# Patient Record
Sex: Female | Born: 1980 | Race: Black or African American | Hispanic: No | Marital: Single | Smoking: Current every day smoker
Health system: Southern US, Community
[De-identification: ages and names within clinical notes are randomized; demographics above are authoritative.]

## PROBLEM LIST (undated history)

## (undated) DIAGNOSIS — F32A Depression, unspecified: Secondary | ICD-10-CM

## (undated) DIAGNOSIS — F329 Major depressive disorder, single episode, unspecified: Secondary | ICD-10-CM

## (undated) DIAGNOSIS — N83209 Unspecified ovarian cyst, unspecified side: Secondary | ICD-10-CM

## (undated) HISTORY — PX: BACK SURGERY: SHX140

## (undated) HISTORY — DX: Depression, unspecified: F32.A

---
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
## 2013-05-19 ENCOUNTER — Emergency Department: Payer: Self-pay | Admitting: Emergency Medicine

## 2013-05-19 LAB — URINALYSIS, COMPLETE
Leukocyte Esterase: NEGATIVE
Nitrite: NEGATIVE
Specific Gravity: 1.008 (ref 1.003–1.030)
Squamous Epithelial: 16

## 2013-09-18 ENCOUNTER — Emergency Department: Payer: Self-pay | Admitting: Emergency Medicine

## 2013-12-24 ENCOUNTER — Emergency Department: Payer: Self-pay | Admitting: Emergency Medicine

## 2013-12-24 LAB — COMPREHENSIVE METABOLIC PANEL
ALBUMIN: 3.5 g/dL (ref 3.4–5.0)
Alkaline Phosphatase: 59 U/L
Anion Gap: 4 — ABNORMAL LOW (ref 7–16)
BUN: 9 mg/dL (ref 7–18)
Bilirubin,Total: 0.2 mg/dL (ref 0.2–1.0)
CHLORIDE: 108 mmol/L — AB (ref 98–107)
CO2: 28 mmol/L (ref 21–32)
Calcium, Total: 8.5 mg/dL (ref 8.5–10.1)
Creatinine: 1.3 mg/dL (ref 0.60–1.30)
EGFR (Non-African Amer.): 53 — ABNORMAL LOW
Glucose: 91 mg/dL (ref 65–99)
Osmolality: 278 (ref 275–301)
Potassium: 3.9 mmol/L (ref 3.5–5.1)
SGOT(AST): 7 U/L — ABNORMAL LOW (ref 15–37)
SGPT (ALT): 11 U/L — ABNORMAL LOW (ref 12–78)
Sodium: 140 mmol/L (ref 136–145)
Total Protein: 6.8 g/dL (ref 6.4–8.2)

## 2013-12-24 LAB — CBC WITH DIFFERENTIAL/PLATELET
BASOS ABS: 0.1 10*3/uL (ref 0.0–0.1)
Basophil %: 1.2 %
Eosinophil #: 0.3 10*3/uL (ref 0.0–0.7)
Eosinophil %: 5.4 %
HCT: 33.3 % — ABNORMAL LOW (ref 35.0–47.0)
HGB: 10.5 g/dL — ABNORMAL LOW (ref 12.0–16.0)
LYMPHS ABS: 1.6 10*3/uL (ref 1.0–3.6)
LYMPHS PCT: 28.8 %
MCH: 27.5 pg (ref 26.0–34.0)
MCHC: 31.7 g/dL — AB (ref 32.0–36.0)
MCV: 87 fL (ref 80–100)
Monocyte #: 0.5 x10 3/mm (ref 0.2–0.9)
Monocyte %: 8.6 %
NEUTROS ABS: 3.2 10*3/uL (ref 1.4–6.5)
NEUTROS PCT: 56 %
Platelet: 214 10*3/uL (ref 150–440)
RBC: 3.83 10*6/uL (ref 3.80–5.20)
RDW: 15.6 % — ABNORMAL HIGH (ref 11.5–14.5)
WBC: 5.7 10*3/uL (ref 3.6–11.0)

## 2013-12-24 LAB — URINALYSIS, COMPLETE
BILIRUBIN, UR: NEGATIVE
GLUCOSE, UR: NEGATIVE mg/dL (ref 0–75)
Ketone: NEGATIVE
Nitrite: NEGATIVE
PROTEIN: NEGATIVE
Ph: 5 (ref 4.5–8.0)
Specific Gravity: 1.006 (ref 1.003–1.030)
Squamous Epithelial: 5
WBC UR: 11 /HPF (ref 0–5)

## 2015-04-25 ENCOUNTER — Encounter: Payer: Self-pay | Admitting: Medical Oncology

## 2015-04-25 ENCOUNTER — Other Ambulatory Visit: Payer: Self-pay

## 2015-04-25 ENCOUNTER — Emergency Department
Admission: EM | Admit: 2015-04-25 | Discharge: 2015-04-25 | Disposition: A | Discharge status: Home / Self Care | Payer: BLUE CROSS/BLUE SHIELD | Attending: Emergency Medicine | Admitting: Emergency Medicine

## 2015-04-25 DIAGNOSIS — Z720 Tobacco use: Secondary | ICD-10-CM | POA: Insufficient documentation

## 2015-04-25 DIAGNOSIS — Z880 Allergy status to penicillin: Secondary | ICD-10-CM | POA: Diagnosis not present

## 2015-04-25 DIAGNOSIS — R42 Dizziness and giddiness: Secondary | ICD-10-CM | POA: Diagnosis present

## 2015-04-25 DIAGNOSIS — Z3202 Encounter for pregnancy test, result negative: Secondary | ICD-10-CM | POA: Insufficient documentation

## 2015-04-25 DIAGNOSIS — R55 Syncope and collapse: Secondary | ICD-10-CM | POA: Diagnosis not present

## 2015-04-25 LAB — BASIC METABOLIC PANEL
Anion gap: 7 (ref 5–15)
BUN: 11 mg/dL (ref 6–20)
CHLORIDE: 106 mmol/L (ref 101–111)
CO2: 25 mmol/L (ref 22–32)
CREATININE: 0.91 mg/dL (ref 0.44–1.00)
Calcium: 8.9 mg/dL (ref 8.9–10.3)
GFR calc Af Amer: >60 mL/min (ref >60)
Glucose, Bld: 106 mg/dL — ABNORMAL HIGH (ref 65–99)
POTASSIUM: 4 mmol/L (ref 3.5–5.1)
Sodium: 138 mmol/L (ref 135–145)

## 2015-04-25 LAB — CBC
HCT: 36.9 % (ref 35.0–47.0)
Hemoglobin: 11.6 g/dL — ABNORMAL LOW (ref 12.0–16.0)
MCH: 26.9 pg (ref 26.0–34.0)
MCHC: 31.5 g/dL — AB (ref 32.0–36.0)
MCV: 85.3 fL (ref 80.0–100.0)
Platelets: 220 10*3/uL (ref 150–440)
RBC: 4.33 MIL/uL (ref 3.80–5.20)
RDW: 16 % — ABNORMAL HIGH (ref 11.5–14.5)
WBC: 6.2 10*3/uL (ref 3.6–11.0)

## 2015-04-25 LAB — GLUCOSE, CAPILLARY: GLUCOSE-CAPILLARY: 87 mg/dL (ref 65–99)

## 2015-04-25 LAB — URINALYSIS COMPLETE WITH MICROSCOPIC (ARMC ONLY)
Bacteria, UA: NONE SEEN
Bilirubin Urine: NEGATIVE
Glucose, UA: NEGATIVE mg/dL
HGB URINE DIPSTICK: NEGATIVE
KETONES UR: NEGATIVE mg/dL
LEUKOCYTES UA: NEGATIVE
Nitrite: NEGATIVE
PH: 5 (ref 5.0–8.0)
Protein, ur: NEGATIVE mg/dL
Specific Gravity, Urine: 1.012 (ref 1.005–1.030)

## 2015-04-25 LAB — TROPONIN I

## 2015-04-25 NOTE — ED Provider Notes (Signed)
[facility] Emergency Department Provider Note  ____________________________________________  Time seen: Approximately 2:14 PM  I have reviewed the triage vital signs and the nursing notes.   HISTORY  Chief Complaint Fatigue and Dizziness    HPI Sandra Sparks is a 34 y.o. female with no significant past medical history other than tobacco use and who has no primary care doctorwho presents feeling generalized weakness and lightheadedness for several days.  She reports that she had an episode of passing out yesterday.  She describe the circumstances in detail: She had not had anything to eat all day yesterday and was enrolling in her classes, waiting in [place], and walking around in the heat.  She was feeling lightheaded and was waiting in another line when she felt like things were starting to get dark and distant.  She apparently blacked out briefly.  She was alert and oriented upon awakening with no evidence of seizure activity.  She says that this is happened to her before under similar circumstances.  She knows that not eating and drinking can have this effect.  She does not have a doctor so she has never discussed this with anyone.  She states that she does not have specific concerns other than her generally not feeling well and feeling "stressed out".  He has 4 children at home in addition to going back to school.  Since the episode yesterday she has felt generalized weakness and lightheadedness.  She has not had any nausea or vomiting, chest pain, shortness of breath, abdominal pain, or dysuria.  She also denies fever and chills.  She describes her symptoms as moderate yesterday mild today.  The episode was gradual in onset yesterday.   History reviewed. No pertinent past medical history.  There are no active problems to display for this patient.   History reviewed. No pertinent past surgical history.  No current outpatient prescriptions on  file.  Allergies Penicillins  History reviewed. No pertinent family history.  Social History Social History  Substance Use Topics  . Smoking status: Current Every Day Smoker -- 0.50 packs/day    Types: Cigarettes  . Smokeless tobacco: None  . Alcohol Use: Yes     Comment: 3 x week    Review of Systems Constitutional: No fever/chills.  Generally feels lightheaded and weak. Eyes: No visual changes. ENT: No sore throat. Cardiovascular: Denies chest pain. Respiratory: Denies shortness of breath. Gastrointestinal: No abdominal pain.  No nausea, no vomiting.  No diarrhea.  No constipation. Genitourinary: Negative for dysuria. Musculoskeletal: Negative for back pain. Skin: Negative for rash. Neurological: Negative for headaches, focal weakness or numbness.  10-point ROS otherwise negative.  ____________________________________________   PHYSICAL EXAM:  VITAL SIGNS: ED Triage Vitals  Enc Vitals Group     BP 04/25/15 1225 117/76 mmHg     Pulse Rate 04/25/15 1225 87     Resp 04/25/15 1225 18     Temp 04/25/15 1225 98.4 F (36.9 C)     Temp Source 04/25/15 1225 Oral     SpO2 04/25/15 1225 98 %     Weight 04/25/15 1225 148 lb (67.132 kg)     Height 04/25/15 1225 5\' 5"  (1.651 m)     Head Cir --      Peak Flow --      Pain Score --      Pain Loc --      Pain Edu? --      Excl. in [place]? --     Constitutional:  Alert and oriented. Well appearing and in no acute distress. VSS. Eyes: Conjunctivae are normal. PERRL. EOMI. Head: Atraumatic. Nose: No congestion/rhinnorhea. Mouth/Throat: Mucous membranes are moist.  Oropharynx non-erythematous. Neck: No stridor.   Cardiovascular: Normal rate, regular rhythm. Grossly normal heart sounds.  Good peripheral circulation. Respiratory: Normal respiratory effort.  No retractions. Lungs CTAB. Gastrointestinal: Soft and nontender. No distention. No abdominal bruits. No CVA tenderness. Musculoskeletal: No lower extremity tenderness nor  edema.  No joint effusions. Neurologic:  Normal speech and language. No gross focal neurologic deficits are appreciated.  Skin:  Skin is warm, dry and intact. No rash noted. Psychiatric: Mood and affect are normal. Speech and behavior are normal.  ____________________________________________   LABS (all labs ordered are listed, but only abnormal results are displayed)  Labs Reviewed  BASIC METABOLIC PANEL - Abnormal; Notable for the following:    Glucose, Bld 106 (*)    All other components within normal limits  CBC - Abnormal; Notable for the following:    Hemoglobin 11.6 (*)    MCHC 31.5 (*)    RDW 16.0 (*)    All other components within normal limits  GLUCOSE, CAPILLARY  URINALYSIS COMPLETEWITH MICROSCOPIC ([facility] ONLY)  TROPONIN I  CBG MONITORING, ED  POC URINE PREG, ED   ____________________________________________  EKG  ED ECG REPORT I, Brittney Caraway, the attending physician, personally viewed and interpreted this ECG.  Date: 04/25/2015 EKG Time: 12:31 Rate: 77 Rhythm: normal sinus rhythm QRS Axis: normal Intervals: normal ST/T Wave abnormalities: Inverted T-wave in lead 3, otherwise unremarkable with no evidence of acute ischemia Conduction Disutrbances: none Narrative Interpretation: unremarkable  ____________________________________________  RADIOLOGY  No results found.  ____________________________________________   PROCEDURES  Procedure(s) performed: None  Critical Care performed: No ____________________________________________   INITIAL IMPRESSION / ASSESSMENT AND PLAN / ED COURSE  Pertinent labs & imaging results that were available during my care of the patient were reviewed by me and considered in my medical decision making (see chart for details).  The patient has a reassuring EKG, lab work, and physical exam.  The [place] syncope rule puts her in the low risk category.  She had what sounds like a vasovagal or orthostatic episode  likely secondary to poor by mouth intake and hot weather and with some exertion.    We discussed this and I encouraged by mouth intake, both clear fluids and food.  I am giving her several names of clinics that she can follow up with to establish a primary care doctor.  I gave her my usual and customary return precautions.  The patient understands and agrees with this plan.   (Note that documentation was delayed due to multiple ED patients requiring immediate care.)  Attempted to obtain orthostatic vital signs, but the patient was upset and refused them in one to leave.  I went back to speak with the patient and explained her reassuring results again.  She became tearful and we had an extensive conversation about all the stressors in her life, for children with no family in the area, going back to school, etc.  She has no SI/HI but is struggling with a lot of social/family issues at this time.  I recommended that she follow up with [facility], and she states that she has been there before and wants to go back again, although the $30 co-pay is difficult for her.  I encouraged her to follow up with them or to return to the emergency department if she needs to do so.  She understands and agrees and seemed to be reassured at the time of discharge.  ____________________________________________  FINAL CLINICAL IMPRESSION(S) / ED DIAGNOSES  Final diagnoses:  Syncope, unspecified syncope type      NEW MEDICATIONS STARTED DURING THIS VISIT:  New Prescriptions   No medications on file     Hinda Kehr, MD 04/25/15 2207

## 2015-04-25 NOTE — Discharge Instructions (Signed)
You have been seen today in the Emergency Department (ED)  for syncope (passing out).  Your workup including vital signs, labs, and EKG show reassuring results.  Your symptoms may be due to dehydration, so it is important that you drink plenty of non-alcoholic fluids.  Please call one of the doctors/clinics listed in these documents as soon as possible to schedule the next available clinic appointment to follow up with him/her regarding your visit to the ED and your symptoms.  Return to the Emergency Department (ED)  if you have any further syncopal episodes (pass out again) or develop ANY chest pain, pressure, tightness, trouble breathing, sudden sweating, or other symptoms that concern you.   Vasovagal Syncope, Adult Syncope, commonly known as fainting, is a temporary loss of consciousness. It occurs when the blood flow to the brain is reduced. Vasovagal syncope (also called neurocardiogenic syncope) is a fainting spell in which the blood flow to the brain is reduced because of a sudden drop in heart rate and blood pressure. Vasovagal syncope occurs when the brain and the cardiovascular system (blood vessels) do not adequately communicate and respond to each other. This is the most common cause of fainting. It often occurs in response to fear or some other type of emotional or physical stress. The body has a reaction in which the heart starts beating too slowly or the blood vessels expand, reducing blood pressure. This type of fainting spell is generally considered harmless. However, injuries can occur if a person takes a sudden fall during a fainting spell.  CAUSES  Vasovagal syncope occurs when a person's blood pressure and heart rate decrease suddenly, usually in response to a trigger. Many things and situations can trigger an episode. Some of these include:   Pain.   Fear.   The sight of blood or medical procedures, such as blood being drawn from a vein.   Common activities, such as  coughing, swallowing, stretching, or going to the bathroom.   Emotional stress.   Prolonged standing, especially in a warm environment.   Lack of sleep or rest.   Prolonged lack of food.   Prolonged lack of fluids.   Recent illness.  The use of certain drugs that affect blood pressure, such as cocaine, alcohol, marijuana, inhalants, and opiates.  SYMPTOMS  Before the fainting episode, you may:   Feel dizzy or light headed.   Become pale.  Sense that you are going to faint.   Feel like the room is spinning.   Have tunnel vision, only seeing directly in front of you.   Feel sick to your stomach (nauseous).   See spots or slowly lose vision.   Hear ringing in your ears.   Have a headache.   Feel warm and sweaty.   Feel a sensation of pins and needles. During the fainting spell, you will generally be unconscious for no longer than a couple minutes before waking up and returning to normal. If you get up too quickly before your body can recover, you may faint again. Some twitching or jerky movements may occur during the fainting spell.  DIAGNOSIS  Your caregiver will ask about your symptoms, take a medical history, and perform a physical exam. Various tests may be done to rule out other causes of fainting. These may include blood tests and tests to check the heart, such as electrocardiography, echocardiography, and possibly an electrophysiology study. When other causes have been ruled out, a test may be done to check the body's response to  changes in position (tilt table test). TREATMENT  Most cases of vasovagal syncope do not require treatment. Your caregiver may recommend ways to avoid fainting triggers and may provide home strategies for preventing fainting. If you must be exposed to a possible trigger, you can drink additional fluids to help reduce your chances of having an episode of vasovagal syncope. If you have warning signs of an oncoming episode, you can  respond by positioning yourself favorably (lying down). If your fainting spells continue, you may be given medicines to prevent fainting. Some medicines may help make you more resistant to repeated episodes of vasovagal syncope. Special exercises or compression stockings may be recommended. In rare cases, the surgical placement of a pacemaker is considered. HOME CARE INSTRUCTIONS   Learn to identify the warning signs of vasovagal syncope.   Sit or lie down at the first warning sign of a fainting spell. If sitting, put your head down between your legs. If you lie down, swing your legs up in the air to increase blood flow to the brain.   Avoid hot tubs and saunas.  Avoid prolonged standing.  Drink enough fluids to keep your urine clear or pale yellow. Avoid caffeine.  Increase salt in your diet as directed by your caregiver.   If you have to stand for a long time, perform movements such as:   Crossing your legs.   Flexing and stretching your leg muscles.   Squatting.   Moving your legs.   Bending over.   Only take over-the-counter or prescription medicines as directed by your caregiver. Do not suddenly stop any medicines without asking your caregiver first. SEEK MEDICAL CARE IF:   Your fainting spells continue or happen more frequently in spite of treatment.   You lose consciousness for more than a couple minutes.  You have fainting spells during or after exercising or after being startled.   You have new symptoms that occur with the fainting spells, such as:   Shortness of breath.  Chest pain.   Irregular heartbeat.   You have episodes of twitching or jerky movements that last longer than a few seconds.  You have episodes of twitching or jerky movements without obvious fainting. SEEK IMMEDIATE MEDICAL CARE IF:   You have injuries or bleeding after a fainting spell.   You have episodes of twitching or jerky movements that last longer than 5 minutes.    You have more than one spell of twitching or jerky movements before returning to consciousness after fainting. MAKE SURE YOU:   Understand these instructions.  Will watch your condition.  Will get help right away if you are not doing well or get worse. Document Released: 08/18/2012 Document Reviewed: 08/18/2012 [facility] Patient Information 2015 [place]. This information is not intended to replace advice given to you by your health care provider. Make sure you discuss any questions you have with your health care provider.

## 2015-04-25 NOTE — ED Notes (Signed)
Patient refused orthostatic vital signs. Patient is ready for discharge papers.

## 2015-04-25 NOTE — ED Notes (Signed)
Pt ambulatory to triage with reports that she "passed out" Monday and since then she has been feeling weak and dizzy. Denies pain. Also reports that she had chest pain Sunday but it has subsided.

## 2015-10-12 ENCOUNTER — Encounter: Payer: Self-pay | Admitting: *Deleted

## 2015-10-12 DIAGNOSIS — Z3202 Encounter for pregnancy test, result negative: Secondary | ICD-10-CM | POA: Insufficient documentation

## 2015-10-12 DIAGNOSIS — N760 Acute vaginitis: Secondary | ICD-10-CM | POA: Insufficient documentation

## 2015-10-12 DIAGNOSIS — Z880 Allergy status to penicillin: Secondary | ICD-10-CM | POA: Diagnosis not present

## 2015-10-12 DIAGNOSIS — R102 Pelvic and perineal pain: Secondary | ICD-10-CM | POA: Diagnosis present

## 2015-10-12 DIAGNOSIS — N83202 Unspecified ovarian cyst, left side: Secondary | ICD-10-CM | POA: Diagnosis not present

## 2015-10-12 DIAGNOSIS — F17210 Nicotine dependence, cigarettes, uncomplicated: Secondary | ICD-10-CM | POA: Insufficient documentation

## 2015-10-12 LAB — COMPREHENSIVE METABOLIC PANEL
ALK PHOS: 60 U/L (ref 38–126)
ALT: 11 U/L — AB (ref 14–54)
ANION GAP: 5 (ref 5–15)
AST: 12 U/L — ABNORMAL LOW (ref 15–41)
Albumin: 4.1 g/dL (ref 3.5–5.0)
BUN: 9 mg/dL (ref 6–20)
CALCIUM: 9 mg/dL (ref 8.9–10.3)
CO2: 28 mmol/L (ref 22–32)
CREATININE: 1.03 mg/dL — AB (ref 0.44–1.00)
Chloride: 106 mmol/L (ref 101–111)
GFR calc Af Amer: >60 mL/min (ref >60)
Glucose, Bld: 109 mg/dL — ABNORMAL HIGH (ref 65–99)
Potassium: 4.1 mmol/L (ref 3.5–5.1)
Sodium: 139 mmol/L (ref 135–145)
TOTAL PROTEIN: 7.5 g/dL (ref 6.5–8.1)

## 2015-10-12 LAB — URINALYSIS COMPLETE WITH MICROSCOPIC (ARMC ONLY)
Bilirubin Urine: NEGATIVE
Glucose, UA: NEGATIVE mg/dL
Hgb urine dipstick: NEGATIVE
Ketones, ur: NEGATIVE mg/dL
Leukocytes, UA: NEGATIVE
Nitrite: POSITIVE — AB
PH: 5 (ref 5.0–8.0)
PROTEIN: NEGATIVE mg/dL
Specific Gravity, Urine: 1.026 (ref 1.005–1.030)

## 2015-10-12 LAB — CBC
HCT: 37.3 % (ref 35.0–47.0)
Hemoglobin: 12 g/dL (ref 12.0–16.0)
MCH: 27.6 pg (ref 26.0–34.0)
MCHC: 32.2 g/dL (ref 32.0–36.0)
MCV: 85.6 fL (ref 80.0–100.0)
Platelets: 263 10*3/uL (ref 150–440)
RBC: 4.35 MIL/uL (ref 3.80–5.20)
RDW: 15.4 % — ABNORMAL HIGH (ref 11.5–14.5)
WBC: 7 10*3/uL (ref 3.6–11.0)

## 2015-10-12 LAB — POCT PREGNANCY, URINE: Preg Test, Ur: NEGATIVE

## 2015-10-12 LAB — LIPASE, BLOOD: Lipase: 22 U/L (ref 11–51)

## 2015-10-12 NOTE — ED Notes (Signed)
Pt c/o RLQ/R pelvic pain x 1 week, worsening tonight starting at 2030. Pt in no acute distress and is ambulatory to triage. Pt smells strongly of marijuana. Pt states she was diagnosed w/ ovarian cysts at [facility] x 2 weeks ago. Pt denies vaginal discharge and bleeding at this time. Pt states she took ibuprofen 600 mg at 17-1800 tonight w/o relief. Pt states she has had pain all week but has been unable to make time to see a physician and has f/u appt w/ ob-gyn in Feb.

## 2015-10-13 ENCOUNTER — Emergency Department: Payer: BLUE CROSS/BLUE SHIELD

## 2015-10-13 ENCOUNTER — Emergency Department
Admission: EM | Admit: 2015-10-13 | Discharge: 2015-10-13 | Disposition: A | Discharge status: Home / Self Care | Payer: BLUE CROSS/BLUE SHIELD | Attending: Emergency Medicine | Admitting: Emergency Medicine

## 2015-10-13 DIAGNOSIS — N83202 Unspecified ovarian cyst, left side: Secondary | ICD-10-CM

## 2015-10-13 DIAGNOSIS — N760 Acute vaginitis: Secondary | ICD-10-CM

## 2015-10-13 DIAGNOSIS — R102 Pelvic and perineal pain: Secondary | ICD-10-CM

## 2015-10-13 DIAGNOSIS — B9689 Other specified bacterial agents as the cause of diseases classified elsewhere: Secondary | ICD-10-CM

## 2015-10-13 HISTORY — DX: Unspecified ovarian cyst, unspecified side: N83.209

## 2015-10-13 LAB — CHLAMYDIA/NGC RT PCR (ARMC ONLY)
CHLAMYDIA TR: NOT DETECTED
N GONORRHOEAE: NOT DETECTED

## 2015-10-13 LAB — WET PREP, GENITAL
Sperm: NONE SEEN
Trich, Wet Prep: NONE SEEN
YEAST WET PREP: NONE SEEN

## 2015-10-13 MED ORDER — KETOROLAC TROMETHAMINE 10 MG PO TABS
10.0000 mg | ORAL_TABLET | Freq: Once | ORAL | Status: AC
  Administered 2015-10-13: 10 mg via ORAL
  Filled 2015-10-13: qty 1

## 2015-10-13 MED ORDER — METRONIDAZOLE 500 MG PO TABS: 500.0000 mg | ORAL_TABLET | Freq: Two times a day (BID) | ORAL | Status: AC

## 2015-10-13 MED ORDER — METRONIDAZOLE 500 MG PO TABS
ORAL_TABLET | ORAL | Status: AC
  Filled 2015-10-13: qty 1

## 2015-10-13 MED ORDER — METRONIDAZOLE 500 MG PO TABS
500.0000 mg | ORAL_TABLET | Freq: Once | ORAL | Status: AC
  Administered 2015-10-13: 500 mg via ORAL

## 2015-10-13 MED ORDER — OXYCODONE-ACETAMINOPHEN 5-325 MG PO TABS: 1.0000 | ORAL_TABLET | ORAL | Status: DC | PRN

## 2015-10-13 NOTE — Discharge Instructions (Signed)

## 2015-10-13 NOTE — ED Provider Notes (Signed)
[facility] Emergency Department Provider Note  ____________________________________________  Time seen: 1:45 AM  I have reviewed the triage vital signs and the nursing notes.   HISTORY  Chief Complaint Pelvic Pain     HPI Sandra Sparks is a 35 y.o. female presents with right pelvic pain times approximately 2 months with acute worsening today. Patient states that she was seen at [facility] 2 weeks ago and diagnosed with an ovarian cyst she is unclear what is     Past Medical History  Diagnosis Date  . Ovarian cyst     There are no active problems to display for this patient.   Past Surgical History  Procedure Laterality Date  . Abdominal surgery    . Back surgery      Current Outpatient Rx  Name  Route  Sig  Dispense  Refill  . ibuprofen (ADVIL,MOTRIN) 600 MG tablet   Oral   Take 600 mg by mouth every 6 (six) hours as needed for mild pain or moderate pain.            Allergies Penicillins  History reviewed. No pertinent family history.  Social History Social History  Substance Use Topics  . Smoking status: Current Every Day Smoker -- 0.50 packs/day    Types: Cigarettes  . Smokeless tobacco: Never Used  . Alcohol Use: Yes     Comment: 3 x week    Review of Systems  Constitutional: Negative for fever. Eyes: Negative for visual changes. ENT: Negative for sore throat. Cardiovascular: Negative for chest pain. Respiratory: Negative for shortness of breath. Gastrointestinal: Negative for abdominal pain, vomiting and diarrhea. Genitourinary: Negative for dysuria. Positive for pelvic pain Musculoskeletal: Negative for back pain. Skin: Negative for rash. Neurological: Negative for headaches, focal weakness or numbness.   10-point ROS otherwise negative.  ____________________________________________   PHYSICAL EXAM:  VITAL SIGNS: ED Triage Vitals  Enc Vitals Group     BP 10/12/15 2233 121/73 mmHg     Pulse Rate 10/12/15 2233  82     Resp 10/12/15 2233 20     Temp 10/12/15 2233 98.1 F (36.7 C)     Temp Source 10/12/15 2233 Oral     SpO2 10/12/15 2233 100 %     Weight 10/12/15 2233 148 lb (67.132 kg)     Height 10/12/15 2233 5\' 5"  (1.651 m)     Head Cir --      Peak Flow --      Pain Score 10/12/15 2234 10     Pain Loc --      Pain Edu? --      Excl. in [place]? --      Constitutional: Alert and oriented. Well appearing and in no distress. Eyes: Conjunctivae are normal. PERRL. Normal extraocular movements. ENT   Head: Normocephalic and atraumatic.   Nose: No congestion/rhinnorhea.   Mouth/Throat: Mucous membranes are moist.   Neck: No stridor. Hematological/Lymphatic/Immunilogical: No cervical lymphadenopathy. Cardiovascular: Normal rate, regular rhythm. Normal and symmetric distal pulses are present in all extremities. No murmurs, rubs, or gallops. Respiratory: Normal respiratory effort without tachypnea nor retractions. Breath sounds are clear and equal bilaterally. No wheezes/rales/rhonchi. Gastrointestinal: Soft and nontender. No distention. There is no CVA tenderness. Genitourinary: deferred Musculoskeletal: Nontender with normal range of motion in all extremities. No joint effusions.  No lower extremity tenderness nor edema. Neurologic:  Normal speech and language. No gross focal neurologic deficits are appreciated. Speech is normal.  Skin:  Skin is warm, dry and intact.  No rash noted. Psychiatric: Mood and affect are normal. Speech and behavior are normal. Patient exhibits appropriate insight and judgment.  ____________________________________________    LABS (pertinent positives/negatives)  Labs Reviewed  COMPREHENSIVE METABOLIC PANEL - Abnormal; Notable for the following:    Glucose, Bld 109 (*)    Creatinine, Ser 1.03 (*)    AST 12 (*)    ALT 11 (*)    Total Bilirubin <0.1 (*)    All other components within normal limits  CBC - Abnormal; Notable for the following:    RDW  15.4 (*)    All other components within normal limits  URINALYSIS COMPLETEWITH MICROSCOPIC ([facility] ONLY) - Abnormal; Notable for the following:    Color, Urine YELLOW (*)    APPearance CLOUDY (*)    Nitrite POSITIVE (*)    Bacteria, UA FEW (*)    Squamous Epithelial / LPF 6-30 (*)    All other components within normal limits  LIPASE, BLOOD  POCT PREGNANCY, URINE      RADIOLOGY      US Pelvis Complete (Final result) Result time: 10/13/15 05:06:32   Final result by Rad Results In Interface (10/13/15 05:06:32)   Narrative:   CLINICAL DATA: Acute onset of right-sided pelvic pain. Initial encounter.  EXAM: TRANSABDOMINAL AND TRANSVAGINAL ULTRASOUND OF PELVIS  TECHNIQUE: Both transabdominal and transvaginal ultrasound examinations of the pelvis were performed. Transabdominal technique was performed for global imaging of the pelvis including uterus, ovaries, adnexal regions, and pelvic cul-de-sac. It was necessary to proceed with endovaginal exam following the transabdominal exam to visualize the endometrium and ovaries in greater detail.  COMPARISON: None  FINDINGS: Uterus  Measurements: 9.8 x 5.5 x 6.3 cm. No fibroids or other mass visualized.  Endometrium  Thickness: 1.3 cm. No focal abnormality visualized.  Right ovary  Measurements: 3.2 x 2.0 x 2.3 cm. A small predominantly hyperechoic 1.8 cm focus within the right ovary may reflect a dermoid cyst.  Left ovary  Measurements: 4.0 x 3.0 x 2.7 cm. A mildly complex 2.5 cm left ovarian cyst is noted, possibly containing minimal hemorrhage.  Other findings  No abnormal free fluid.  IMPRESSION: 1. No evidence for ovarian torsion. Uterus unremarkable in appearance. 2. Small dermoid cyst suggested at the right ovary. 3. Mildly complex 2.5 cm left ovarian cyst may contain minimal hemorrhage.   Electronically Signed By: Garald Balding M.D. On: 10/13/2015 05:06          US Transvaginal Non-OB  (Final result) Result time: 10/13/15 05:06:32   Final result by Rad Results In Interface (10/13/15 05:06:32)   Narrative:   CLINICAL DATA: Acute onset of right-sided pelvic pain. Initial encounter.  EXAM: TRANSABDOMINAL AND TRANSVAGINAL ULTRASOUND OF PELVIS  TECHNIQUE: Both transabdominal and transvaginal ultrasound examinations of the pelvis were performed. Transabdominal technique was performed for global imaging of the pelvis including uterus, ovaries, adnexal regions, and pelvic cul-de-sac. It was necessary to proceed with endovaginal exam following the transabdominal exam to visualize the endometrium and ovaries in greater detail.  COMPARISON: None  FINDINGS: Uterus  Measurements: 9.8 x 5.5 x 6.3 cm. No fibroids or other mass visualized.  Endometrium  Thickness: 1.3 cm. No focal abnormality visualized.  Right ovary  Measurements: 3.2 x 2.0 x 2.3 cm. A small predominantly hyperechoic 1.8 cm focus within the right ovary may reflect a dermoid cyst.  Left ovary  Measurements: 4.0 x 3.0 x 2.7 cm. A mildly complex 2.5 cm left ovarian cyst is noted, possibly containing minimal hemorrhage.  Other findings  No abnormal free fluid.  IMPRESSION: 1. No evidence for ovarian torsion. Uterus unremarkable in appearance. 2. Small dermoid cyst suggested at the right ovary. 3. Mildly complex 2.5 cm left ovarian cyst may contain minimal hemorrhage.   Electronically Signed By: Garald Balding M.D. On: 10/13/2015 05:06          ____________________________________________   INITIAL IMPRESSION / ASSESSMENT AND PLAN / ED COURSE  Pertinent labs & imaging results that were available during my care of the patient were reviewed by me and considered in my medical decision making (see chart for details).  History & physical exam consistent with ovarian cyst which was confirmed on pelvic ultrasound. In addition wet prep revealed bacterial vaginosis for which  patient received Flagyl   ____________________________________________   FINAL CLINICAL IMPRESSION(S) / ED DIAGNOSES  Final diagnoses:  Left ovarian cyst  Bacterial vaginosis      Gregor Hams, MD 10/13/15 [id number]

## 2016-01-03 ENCOUNTER — Other Ambulatory Visit: Payer: Self-pay | Admitting: Family Medicine

## 2016-01-04 ENCOUNTER — Other Ambulatory Visit: Payer: Self-pay | Admitting: Family Medicine

## 2016-01-04 DIAGNOSIS — Z8742 Personal history of other diseases of the female genital tract: Secondary | ICD-10-CM

## 2016-01-11 ENCOUNTER — Ambulatory Visit
Admission: RE | Admit: 2016-01-11 | Discharge: 2016-01-11 | Disposition: A | Discharge status: Home / Self Care | Payer: BLUE CROSS/BLUE SHIELD | Source: Ambulatory Visit | Attending: Family Medicine | Admitting: Family Medicine

## 2016-01-11 DIAGNOSIS — N83201 Unspecified ovarian cyst, right side: Secondary | ICD-10-CM | POA: Insufficient documentation

## 2016-01-11 DIAGNOSIS — Z8742 Personal history of other diseases of the female genital tract: Secondary | ICD-10-CM

## 2016-06-12 DIAGNOSIS — R102 Pelvic and perineal pain: Secondary | ICD-10-CM | POA: Insufficient documentation

## 2016-06-12 DIAGNOSIS — D369 Benign neoplasm, unspecified site: Secondary | ICD-10-CM | POA: Insufficient documentation

## 2016-07-01 ENCOUNTER — Emergency Department
Admission: EM | Admit: 2016-07-01 | Discharge: 2016-07-01 | Disposition: A | Discharge status: Home / Self Care | Payer: BLUE CROSS/BLUE SHIELD | Attending: Emergency Medicine | Admitting: Emergency Medicine

## 2016-07-01 ENCOUNTER — Encounter: Payer: Self-pay | Admitting: Medical Oncology

## 2016-07-01 ENCOUNTER — Emergency Department: Payer: BLUE CROSS/BLUE SHIELD

## 2016-07-01 DIAGNOSIS — J069 Acute upper respiratory infection, unspecified: Secondary | ICD-10-CM | POA: Insufficient documentation

## 2016-07-01 DIAGNOSIS — Z791 Long term (current) use of non-steroidal anti-inflammatories (NSAID): Secondary | ICD-10-CM | POA: Insufficient documentation

## 2016-07-01 DIAGNOSIS — F17210 Nicotine dependence, cigarettes, uncomplicated: Secondary | ICD-10-CM | POA: Diagnosis not present

## 2016-07-01 DIAGNOSIS — R05 Cough: Secondary | ICD-10-CM | POA: Diagnosis present

## 2016-07-01 DIAGNOSIS — R0789 Other chest pain: Secondary | ICD-10-CM | POA: Insufficient documentation

## 2016-07-01 DIAGNOSIS — R112 Nausea with vomiting, unspecified: Secondary | ICD-10-CM | POA: Insufficient documentation

## 2016-07-01 MED ORDER — BENZONATATE 200 MG PO CAPS
200.0000 mg | ORAL_CAPSULE | Freq: Three times a day (TID) | ORAL | 0 refills | Status: DC | PRN
Start: 2016-07-01 — End: 2020-01-06

## 2016-07-01 MED ORDER — HYDROCOD POLST-CPM POLST ER 10-8 MG/5ML PO SUER: 5.0000 mL | Freq: Every evening | ORAL | 0 refills | Status: DC | PRN

## 2016-07-01 MED ORDER — IBUPROFEN 600 MG PO TABS: 600.0000 mg | ORAL_TABLET | Freq: Three times a day (TID) | ORAL | 0 refills | Status: DC | PRN

## 2016-07-01 NOTE — ED Notes (Signed)
States she developed chest congestion with cough about 1 week ago   Unsure of fever   Having some discomfort in chest with cough

## 2016-07-01 NOTE — ED Provider Notes (Signed)
[facility] Emergency Department Provider Note   ____________________________________________   First MD Initiated Contact with Patient 07/01/16 1656     (approximate)  I have reviewed the triage vital signs and the nursing notes.   HISTORY  Chief Complaint Cough; Fatigue; and Chest Pain   HPI Sandra Sparks is a 35 y.o. female with one week history of productive cough and associated chest pain unrelieved by OTC tylenol and Nyquil. She describes the pain as a tightness made worse with coughing. She denies hemoptysis, wheezing, or trouble breathing. Patient states she has had some N/V/D associated with these symptoms as well. No sick contacts.   Past Medical History:  Diagnosis Date  . Ovarian cyst     There are no active problems to display for this patient.   Past Surgical History:  Procedure Laterality Date  . BACK SURGERY      Prior to Admission medications   Medication Sig Start Date End Date Taking? Authorizing Provider  benzonatate (TESSALON) 200 MG capsule Take 1 capsule (200 mg total) by mouth 3 (three) times daily as needed for cough. 07/01/16   Sable Feil, PA-C  chlorpheniramine-HYDROcodone [facility] ER) 10-8 MG/5ML SUER Take 5 mLs by mouth at bedtime as needed for cough. 07/01/16   Sable Feil, PA-C  ibuprofen (ADVIL,MOTRIN) 600 MG tablet Take 600 mg by mouth every 6 (six) hours as needed for mild pain or moderate pain.     Historical Provider, MD  ibuprofen (ADVIL,MOTRIN) 600 MG tablet Take 1 tablet (600 mg total) by mouth every 8 (eight) hours as needed. 07/01/16   Sable Feil, PA-C  oxyCODONE-acetaminophen (ROXICET) 5-325 MG tablet Take 1 tablet by mouth every 4 (four) hours as needed for severe pain. 10/13/15   Gregor Hams, MD    Allergies Penicillins  No family history on file.  Social History Social History  Substance Use Topics  . Smoking status: Current Every Day Smoker    Packs/day: 0.50   Types: Cigarettes  . Smokeless tobacco: Never Used  . Alcohol use Yes     Comment: 3 x week    Review of Systems Constitutional: Subjective fever and positive for fatigue  ENT: Positive for sore throat. Cardiovascular: Chest pain associated with cough Respiratory: Denies shortness of breath or wheezing. Positive for cough Gastrointestinal: No abdominal pain.  Occasional nausea, vomiting, and diarrhea. Genitourinary: Negative for dysuria. Musculoskeletal: Negative for back pain. Skin: Negative for rash. Neurological: Negative for headaches, focal weakness or numbness. 10-point ROS otherwise negative.  ____________________________________________   PHYSICAL EXAM:  VITAL SIGNS: ED Triage Vitals  Enc Vitals Group     BP 07/01/16 1628 121/60     Pulse Rate 07/01/16 1628 76     Resp 07/01/16 1628 18     Temp 07/01/16 1628 98.3 F (36.8 C)     Temp Source 07/01/16 1628 Oral     SpO2 07/01/16 1628 100 %     Weight 07/01/16 1629 156 lb (70.8 kg)     Height 07/01/16 1629 5\' 6"  (1.676 m)     Head Circumference --      Peak Flow --      Pain Score 07/01/16 1636 8     Pain Loc --      Pain Edu? --      Excl. in [place]? --     Constitutional: Alert and oriented. Well appearing and in no acute distress. Eyes: Conjunctivae are normal. PERRL. EOMI. Head: Atraumatic. Nose:  No congestion/rhinnorhea. Mouth/Throat: Mucous membranes are moist.  Oropharynx non-erythematous.  Hematological/Lymphatic/Immunilogical: No cervical lymphadenopathy. Cardiovascular: Normal rate, regular rhythm. Grossly normal heart sounds.  Good peripheral circulation. Respiratory: Normal respiratory effort.  No retractions. Lungs CTAB. Gastrointestinal: Soft and nontender. No distention.  Musculoskeletal: Reproducible costochondral chest pain on palpation Neurologic:  Normal speech and language. No gross focal neurologic deficits are appreciated. No gait instability. Skin:  Skin is warm, dry and intact. No rash  noted. Psychiatric: Mood and affect are normal. Speech and behavior are normal.  ____________________________________________   LABS (all labs ordered are listed, but only abnormal results are displayed)  Labs Reviewed - No data to display ____________________________________________  EKG  EKG read by heart station doctor  ____________________________________________  RADIOLOGY  2-View chest x-ray showed no acute process ____________________________________________   PROCEDURES  Procedure(s) performed: None  Procedures  Critical Care performed: No  ____________________________________________   INITIAL IMPRESSION / ASSESSMENT AND PLAN / ED COURSE  Pertinent labs & imaging results that were available during my care of the patient were reviewed by me and considered in my medical decision making (see chart for details).  Upper respiratory infection 1. EKG for chest pain associated with cough was read by heart station doctor and showed no STEMI  2. 2-view chest x-ray showed no acute process  3. Continue supportive care with rest and fluids 4. Tessalon pearls and ibuprofen for day time cough and pain and tussionex for PM cough and pain.  5. Patient was given a work note  Clinical Course     ____________________________________________   FINAL CLINICAL IMPRESSION(S) / ED DIAGNOSES  Final diagnoses:  Upper respiratory tract infection, unspecified type      NEW MEDICATIONS STARTED DURING THIS VISIT:  New Prescriptions   BENZONATATE (TESSALON) 200 MG CAPSULE    Take 1 capsule (200 mg total) by mouth 3 (three) times daily as needed for cough.   CHLORPHENIRAMINE-HYDROCODONE (TUSSIONEX PENNKINETIC ER) 10-8 MG/5ML SUER    Take 5 mLs by mouth at bedtime as needed for cough.   IBUPROFEN (ADVIL,MOTRIN) 600 MG TABLET    Take 1 tablet (600 mg total) by mouth every 8 (eight) hours as needed.     Note:  This document was prepared using Dragon voice recognition  software and may include unintentional dictation errors.   Sable Feil, PA-C 07/01/16 [place], MD 07/01/16 2122

## 2016-07-01 NOTE — ED Notes (Signed)
Pt verbalized understanding of discharge instructions. NAD at this time. 

## 2016-07-01 NOTE — ED Triage Notes (Signed)
Pt reports chest pain with coughing that began 1 week ago with cold sx's and fatigue.

## 2016-07-01 NOTE — ED Notes (Signed)
EKG signed by Dr Archie Balboa, based on sx's pt can have x-ray and be seen in flex care area.

## 2017-01-10 ENCOUNTER — Encounter: Payer: Self-pay | Admitting: Emergency Medicine

## 2017-01-10 ENCOUNTER — Emergency Department
Admission: EM | Admit: 2017-01-10 | Discharge: 2017-01-10 | Disposition: A | Discharge status: Home / Self Care | Payer: BLUE CROSS/BLUE SHIELD | Attending: Emergency Medicine | Admitting: Emergency Medicine

## 2017-01-10 DIAGNOSIS — F17210 Nicotine dependence, cigarettes, uncomplicated: Secondary | ICD-10-CM | POA: Insufficient documentation

## 2017-01-10 DIAGNOSIS — Z791 Long term (current) use of non-steroidal anti-inflammatories (NSAID): Secondary | ICD-10-CM | POA: Insufficient documentation

## 2017-01-10 DIAGNOSIS — Z046 Encounter for general psychiatric examination, requested by authority: Secondary | ICD-10-CM | POA: Diagnosis present

## 2017-01-10 DIAGNOSIS — F3341 Major depressive disorder, recurrent, in partial remission: Secondary | ICD-10-CM | POA: Diagnosis not present

## 2017-01-10 DIAGNOSIS — Z79899 Other long term (current) drug therapy: Secondary | ICD-10-CM | POA: Diagnosis not present

## 2017-01-10 LAB — COMPREHENSIVE METABOLIC PANEL
ALT: 15 U/L (ref 14–54)
ANION GAP: 7 (ref 5–15)
AST: 15 U/L (ref 15–41)
Albumin: 3.8 g/dL (ref 3.5–5.0)
Alkaline Phosphatase: 56 U/L (ref 38–126)
BILIRUBIN TOTAL: 0.5 mg/dL (ref 0.3–1.2)
BUN: 9 mg/dL (ref 6–20)
CHLORIDE: 103 mmol/L (ref 101–111)
CO2: 24 mmol/L (ref 22–32)
Calcium: 8.7 mg/dL — ABNORMAL LOW (ref 8.9–10.3)
Creatinine, Ser: 0.9 mg/dL (ref 0.44–1.00)
GFR calc Af Amer: >60 mL/min (ref >60)
GFR calc non Af Amer: >60 mL/min (ref >60)
Glucose, Bld: 96 mg/dL (ref 65–99)
POTASSIUM: 4 mmol/L (ref 3.5–5.1)
Sodium: 134 mmol/L — ABNORMAL LOW (ref 135–145)
TOTAL PROTEIN: 7.1 g/dL (ref 6.5–8.1)

## 2017-01-10 LAB — CBC
HCT: 36.8 % (ref 35.0–47.0)
Hemoglobin: 12.1 g/dL (ref 12.0–16.0)
MCH: 28.1 pg (ref 26.0–34.0)
MCHC: 32.9 g/dL (ref 32.0–36.0)
MCV: 85.5 fL (ref 80.0–100.0)
PLATELETS: 252 10*3/uL (ref 150–440)
RBC: 4.3 MIL/uL (ref 3.80–5.20)
RDW: 15.7 % — AB (ref 11.5–14.5)
WBC: 4.9 10*3/uL (ref 3.6–11.0)

## 2017-01-10 LAB — ETHANOL

## 2017-01-10 LAB — SALICYLATE LEVEL

## 2017-01-10 LAB — ACETAMINOPHEN LEVEL: Acetaminophen (Tylenol), Serum: <10 ug/mL — ABNORMAL LOW (ref 10–30)

## 2017-01-10 MED ORDER — VENLAFAXINE HCL ER 37.5 MG PO CP24
37.5000 mg | ORAL_CAPSULE | ORAL | Status: AC
  Administered 2017-01-10: 37.5 mg via ORAL
  Filled 2017-01-10: qty 1

## 2017-01-10 MED ORDER — VENLAFAXINE HCL ER 37.5 MG PO CP24: 37.5000 mg | ORAL_CAPSULE | Freq: Two times a day (BID) | ORAL | 0 refills | Status: DC

## 2017-01-10 NOTE — ED Triage Notes (Addendum)
Pt reports running out of effexor four days ago. Pt presents requesting help with depression. Pt reports has felt some suicidal ideation the past few days. Denies SI today. Pt denied having a plan to hurt self. Pt presents voluntarily.

## 2017-01-10 NOTE — ED Notes (Addendum)
Pt has two earrings in upper right earlobe that could not be removed. Theadora Rama, Charge RN and Maudie Mercury, RN assigned nurse made aware.

## 2017-01-10 NOTE — ED Notes (Signed)
Pt verbalized understanding of discharge instructions. NAD at this time. 

## 2017-01-10 NOTE — ED Notes (Signed)
Gave dinner tray to pt. Pt stated not hungry will eat later.AS

## 2017-01-10 NOTE — ED Notes (Signed)
Pt changed into paper scrubs by this Probation officer.

## 2017-01-10 NOTE — ED Provider Notes (Signed)
[facility] Emergency Department Provider Note  ____________________________________________  Time seen: Approximately 4:54 PM  I have reviewed the triage vital signs and the nursing notes.   HISTORY  Chief Complaint Depression and Psychiatric Evaluation    HPI Sandra Sparks is a 36 y.o. female who reports that she has been on Effexor for a long time for her depression. She ran out 4 days ago and over last 4 days she's had worsening symptoms of feeling sad, tearfulness, poor appetite and poor sleep. Reports having some passing suicidal thoughts yesterday, which she discussed with a friend and her occupational nurse at work. They provided support, she went home and went to sleep to rest. She denies any plan or intention to harm herself. No HI or hallucinations. Denies history of suicide attempt. Follows up with Princella Ion.     Past Medical History:  Diagnosis Date  . Ovarian cyst      There are no active problems to display for this patient.    Past Surgical History:  Procedure Laterality Date  . BACK SURGERY       Prior to Admission medications   Medication Sig Start Date End Date Taking? Authorizing Provider  benzonatate (TESSALON) 200 MG capsule Take 1 capsule (200 mg total) by mouth 3 (three) times daily as needed for cough. Patient not taking: Reported on 01/10/2017 07/01/16   Sable Feil, PA-C  chlorpheniramine-HYDROcodone [facility] ER) 10-8 MG/5ML SUER Take 5 mLs by mouth at bedtime as needed for cough. Patient not taking: Reported on 01/10/2017 07/01/16   Sable Feil, PA-C  ibuprofen (ADVIL,MOTRIN) 600 MG tablet Take 1 tablet (600 mg total) by mouth every 8 (eight) hours as needed. Patient not taking: Reported on 01/10/2017 07/01/16   Sable Feil, PA-C  oxyCODONE-acetaminophen (ROXICET) 5-325 MG tablet Take 1 tablet by mouth every 4 (four) hours as needed for severe pain. Patient not taking: Reported on 01/10/2017  10/13/15   Gregor Hams, MD  venlafaxine XR (EFFEXOR-XR) 37.5 MG 24 hr capsule Take 1 capsule (37.5 mg total) by mouth 2 (two) times daily. 01/10/17   Carrie Mew, MD     Allergies Penicillins   No family history on file.  Social History Social History  Substance Use Topics  . Smoking status: Current Every Day Smoker    Packs/day: 0.50    Types: Cigarettes  . Smokeless tobacco: Never Used  . Alcohol use Yes     Comment: 3 x week    Review of Systems  Constitutional:   No fever or chills.  ENT:   No sore throat. No rhinorrhea. Lymphatic: No swollen glands, No extremity swelling Endocrine: No hot/cold flashes. No significant weight change. No neck swelling. Cardiovascular:   No chest pain or syncope. Respiratory:   No dyspnea or cough. Gastrointestinal:   Negative for abdominal pain, vomiting and diarrhea.  Genitourinary:   Negative for dysuria or difficulty urinating. Musculoskeletal:   Negative for focal pain or swelling Neurological:   Negative for headaches or weakness. All other systems reviewed and are negative except as documented above in ROS and HPI.  ____________________________________________   PHYSICAL EXAM:  VITAL SIGNS: ED Triage Vitals [01/10/17 1442]  Enc Vitals Group     BP 109/70     Pulse Rate 68     Resp 18     Temp 98.8 F (37.1 C)     Temp Source Oral     SpO2 100 %     Weight 162  lb (73.5 kg)     Height 5\' 5"  (1.651 m)     Head Circumference      Peak Flow      Pain Score      Pain Loc      Pain Edu?      Excl. in [place]?     Vital signs reviewed, nursing assessments reviewed.   Constitutional:   Alert and oriented. Well appearing and in no distress. Eyes:   No scleral icterus. No conjunctival pallor. PERRL. EOMI.  No nystagmus. ENT   Head:   Normocephalic and atraumatic.   Nose:   No congestion/rhinnorhea. No septal hematoma   Mouth/Throat:   MMM, no pharyngeal erythema. No peritonsillar mass.    Neck:   No  stridor. No SubQ emphysema. No meningismus. Hematological/Lymphatic/Immunilogical:   No cervical lymphadenopathy. Cardiovascular:   RRR. Symmetric bilateral radial and DP pulses.  No murmurs.  Respiratory:   Normal respiratory effort without tachypnea nor retractions. Breath sounds are clear and equal bilaterally. No wheezes/rales/rhonchi. Gastrointestinal:   Soft and nontender. Non distended. There is no CVA tenderness.  No rebound, rigidity, or guarding. Genitourinary:   deferred Musculoskeletal:   Normal range of motion in all extremities. No joint effusions.  No lower extremity tenderness.  No edema. Neurologic:   Normal speech and language.  CN 2-10 normal. Motor grossly intact. No gross focal neurologic deficits are appreciated.  Skin:    Skin is warm, dry and intact. No rash noted.  No petechiae, purpura, or bullae.  ____________________________________________    LABS (pertinent positives/negatives) (all labs ordered are listed, but only abnormal results are displayed) Labs Reviewed  COMPREHENSIVE METABOLIC PANEL - Abnormal; Notable for the following:       Result Value   Sodium 134 (*)    Calcium 8.7 (*)    All other components within normal limits  ACETAMINOPHEN LEVEL - Abnormal; Notable for the following:    Acetaminophen (Tylenol), Serum <10 (*)    All other components within normal limits  CBC - Abnormal; Notable for the following:    RDW 15.7 (*)    All other components within normal limits  ETHANOL  SALICYLATE LEVEL  URINE DRUG SCREEN, QUALITATIVE ([facility] ONLY)   ____________________________________________   EKG    ____________________________________________    RADIOLOGY  No results found.  ____________________________________________   PROCEDURES Procedures  ____________________________________________   INITIAL IMPRESSION / ASSESSMENT AND PLAN / ED COURSE  Pertinent labs & imaging results that were available during my care of the patient  were reviewed by me and considered in my medical decision making (see chart for details).  Patient is well-appearing, no acute distress. Normal vital signs. Unremarkable physical exam. Complains of worsening depression symptoms over the last few days in the setting of running out of her Effexor 4 days ago. She does not appear to be a danger to herself or others. She does not meet criteria for commitment. At this time she does not appear to warrant a psychiatry consultation as this is a question of medication refill. I advised her to continue following up with her primary care clinic. She expressed an interest in changing to another clinic where she would have one assigned physician that she would see regularly. I counseled her that this may take several months to get a new patient appointment and to continue following up with Princella Ion in the meantime while she works on that. She agrees. I will refill her Effexor today and give her a dose  now. Return precautions given. She has demonstrated that she is able to seek support and access resources if her depression further decompensates and she has any further suicidal thoughts. Very low risk and suitable for outpatient follow-up.     Clinical Course as of Jan 10 1653  Sat Jan 10, 2017  1551 P/w worsening depression sx after running out of Effexor 4 days ago. No si/hi/vah. Not a danger to self or others. Will refill med, f/u pcp.   [PS]    Clinical Course User Index [PS] Carrie Mew, MD     ____________________________________________   FINAL CLINICAL IMPRESSION(S) / ED DIAGNOSES  Final diagnoses:  Recurrent major depressive disorder, in partial remission [facility])      Current Discharge Medication List       Portions of this note were generated with dragon dictation software. Dictation errors may occur despite best attempts at proofreading.    Carrie Mew, MD 01/10/17 [phone]

## 2017-01-10 NOTE — ED Notes (Signed)
Pt sent pocketbook including wallet home with boyfriend.

## 2017-01-10 NOTE — ED Notes (Signed)
Pt told this RN she ran out of her depression medication on Tuesday and that she has been experiencing an increase in her depression since then. Pt states that she having a difficult time dealing with stressors in her life. Pt denies SI/HI.

## 2017-02-13 ENCOUNTER — Emergency Department
Admission: EM | Admit: 2017-02-13 | Discharge: 2017-02-13 | Disposition: A | Discharge status: Home / Self Care | Payer: BLUE CROSS/BLUE SHIELD | Attending: Emergency Medicine | Admitting: Emergency Medicine

## 2017-02-13 ENCOUNTER — Encounter: Payer: Self-pay | Admitting: Emergency Medicine

## 2017-02-13 DIAGNOSIS — Z79899 Other long term (current) drug therapy: Secondary | ICD-10-CM | POA: Insufficient documentation

## 2017-02-13 DIAGNOSIS — M542 Cervicalgia: Secondary | ICD-10-CM | POA: Diagnosis present

## 2017-02-13 DIAGNOSIS — F17210 Nicotine dependence, cigarettes, uncomplicated: Secondary | ICD-10-CM | POA: Diagnosis not present

## 2017-02-13 DIAGNOSIS — M791 Myalgia: Secondary | ICD-10-CM | POA: Diagnosis not present

## 2017-02-13 DIAGNOSIS — M7918 Myalgia, other site: Secondary | ICD-10-CM

## 2017-02-13 LAB — URINALYSIS, COMPLETE (UACMP) WITH MICROSCOPIC
Bacteria, UA: NONE SEEN
Bilirubin Urine: NEGATIVE
GLUCOSE, UA: NEGATIVE mg/dL
Hgb urine dipstick: NEGATIVE
KETONES UR: NEGATIVE mg/dL
Leukocytes, UA: NEGATIVE
Nitrite: NEGATIVE
PH: 6 (ref 5.0–8.0)
Protein, ur: NEGATIVE mg/dL
Specific Gravity, Urine: 1.026 (ref 1.005–1.030)

## 2017-02-13 LAB — POCT PREGNANCY, URINE: Preg Test, Ur: NEGATIVE

## 2017-02-13 MED ORDER — ORPHENADRINE CITRATE 30 MG/ML IJ SOLN
60.0000 mg | Freq: Two times a day (BID) | INTRAMUSCULAR | Status: DC
  Administered 2017-02-13: 60 mg via INTRAMUSCULAR
  Filled 2017-02-13: qty 2

## 2017-02-13 MED ORDER — NAPROXEN 500 MG PO TABS: 500.0000 mg | ORAL_TABLET | Freq: Two times a day (BID) | ORAL | 0 refills | Status: AC

## 2017-02-13 MED ORDER — KETOROLAC TROMETHAMINE 60 MG/2ML IM SOLN
30.0000 mg | Freq: Once | INTRAMUSCULAR | Status: AC
  Administered 2017-02-13: 30 mg via INTRAMUSCULAR
  Filled 2017-02-13: qty 2

## 2017-02-13 MED ORDER — CYCLOBENZAPRINE HCL 5 MG PO TABS: 5.0000 mg | ORAL_TABLET | Freq: Three times a day (TID) | ORAL | 0 refills | Status: AC | PRN

## 2017-02-13 NOTE — ED Triage Notes (Addendum)
Pt c/o lower back pain with urinary frequency for 2 days. Denies fevers.  Also would like neck checked while here. Having neck pain. Pain to neck when RN palpated.  Denies injury to neck. Has icy hot patch to neck, helping pain. Pt works in pt care and lifts pts.

## 2017-02-13 NOTE — ED Provider Notes (Signed)
[facility] Emergency Department Provider Note  ____________________________________________  Time seen: Approximately 6:37 PM  I have reviewed the triage vital signs and the nursing notes.   HISTORY  Chief Complaint Urinary Frequency and Neck Pain    HPI Sandra Sparks is a 36 y.o. female that presents to the emergency department with neck pain, upper back pain, low back pain, and urinary frequency for 2 days. She states that pain is constant and throbbing. 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 No recent illness or sick contacts. She is concerned for UTI. She denies fever, headache, visual changes, SOB, CP, nausea, vomiting, abdominal pain, dysuria, hematuria, bowel or bladder dysfunction, saddle paresthesias, vaginal discharge, numbness, tingling.    Past Medical History:  Diagnosis Date  . Ovarian cyst     There are no active problems to display for this patient.   Past Surgical History:  Procedure Laterality Date  . BACK SURGERY      Prior to Admission medications   Medication Sig Start Date End Date Taking? Authorizing Provider  benzonatate (TESSALON) 200 MG capsule Take 1 capsule (200 mg total) by mouth 3 (three) times daily as needed for cough. Patient not taking: Reported on 01/10/2017 07/01/16   Sable Feil, PA-C  chlorpheniramine-HYDROcodone [facility] ER) 10-8 MG/5ML SUER Take 5 mLs by mouth at bedtime as needed for cough. Patient not taking: Reported on 01/10/2017 07/01/16   Sable Feil, PA-C  cyclobenzaprine (FLEXERIL) 5 MG tablet Take 1 tablet (5 mg total) by mouth 3 (three) times daily as needed for muscle spasms. 02/13/17 02/20/17  Laban Emperor, PA-C  ibuprofen (ADVIL,MOTRIN) 600 MG tablet Take 1 tablet (600 mg total) by mouth every 8 (eight) hours as needed. Patient not taking: Reported on 01/10/2017 07/01/16   Sable Feil, PA-C  naproxen (NAPROSYN) 500 MG tablet Take 1 tablet (500 mg total) by mouth 2 (two)  times daily with a meal. 02/13/17 02/13/18  Laban Emperor, PA-C  oxyCODONE-acetaminophen (ROXICET) 5-325 MG tablet Take 1 tablet by mouth every 4 (four) hours as needed for severe pain. Patient not taking: Reported on 01/10/2017 10/13/15   Gregor Hams, MD  venlafaxine XR (EFFEXOR-XR) 37.5 MG 24 hr capsule Take 1 capsule (37.5 mg total) by mouth 2 (two) times daily. 01/10/17   Carrie Mew, MD    Allergies Penicillins  History reviewed. No pertinent family history.  Social History Social History  Substance Use Topics  . Smoking status: Current Every Day Smoker    Packs/day: 0.50    Types: Cigarettes  . Smokeless tobacco: Never Used  . Alcohol use Yes     Comment: 3 x week     Review of Systems  Constitutional: No fever/chills ENT: No upper respiratory complaints. Cardiovascular: No chest pain. Respiratory: No SOB. Gastrointestinal: No abdominal pain.  No nausea, no vomiting.  Genitourinary: Negative for dysuria. Musculoskeletal: Positive for back pain. Skin: Negative for rash, abrasions, lacerations, ecchymosis. Neurological: Negative for headaches, numbness or tingling   ____________________________________________   PHYSICAL EXAM:  VITAL SIGNS: ED Triage Vitals  Enc Vitals Group     BP 02/13/17 1740 (!) 119/42     Pulse Rate 02/13/17 1739 69     Resp 02/13/17 1739 18     Temp 02/13/17 1739 98.7 F (37.1 C)     Temp Source 02/13/17 1739 Oral     SpO2 02/13/17 1739 100 %     Weight 02/13/17 1739 160 lb (72.6 kg)  Height 02/13/17 1739 5\' 6"  (1.676 m)     Head Circumference --      Peak Flow --      Pain Score 02/13/17 1739 10     Pain Loc --      Pain Edu? --      Excl. in [place]? --      Constitutional: Alert and oriented. Well appearing and in no acute distress. Eyes: Conjunctivae are normal. PERRL. EOMI. Head: Atraumatic. ENT:      Ears:      Nose: No congestion/rhinnorhea.      Mouth/Throat: Mucous membranes are moist.  Neck: No stridor.  No  cervical spine tenderness to palpation. Full range of motion of neck. Cardiovascular: Normal rate, regular rhythm.  Good peripheral circulation. Respiratory: Normal respiratory effort without tachypnea or retractions. Lungs CTAB. Good air entry to the bases with no decreased or absent breath sounds. Gastrointestinal: Bowel sounds 4 quadrants. Soft and nontender to palpation. No guarding or rigidity. No palpable masses. No distention.  Musculoskeletal: Full range of motion to all extremities. No gross deformities appreciated. Tenderness to palpation throughout lumbar muscles. No focal tenderness over thoracic or lumbar spine. Negative straight leg raise and cross leg raise. Neurologic:  Normal speech and language. No gross focal neurologic deficits are appreciated.  Skin:  Skin is warm, dry and intact. No rash noted.   ____________________________________________   LABS (all labs ordered are listed, but only abnormal results are displayed)  Labs Reviewed  URINALYSIS, COMPLETE (UACMP) WITH MICROSCOPIC - Abnormal; Notable for the following:       Result Value   Color, Urine YELLOW (*)    APPearance HAZY (*)    Squamous Epithelial / LPF 6-30 (*)    All other components within normal limits  URINE CULTURE  POC URINE PREG, ED  POCT PREGNANCY, URINE   ____________________________________________  EKG   ____________________________________________  RADIOLOGY  No results found.  ____________________________________________    PROCEDURES  Procedure(s) performed:    Procedures    Medications  ketorolac (TORADOL) injection 30 mg (30 mg Intramuscular Given 02/13/17 1846)     ____________________________________________   INITIAL IMPRESSION / ASSESSMENT AND PLAN / ED COURSE  Pertinent labs & imaging results that were available during my care of the patient were reviewed by me and considered in my medical decision making (see chart for details).  Review of the [place] CSRS was  performed in accordance of the [place] prior to dispensing any controlled drugs.  Patient's diagnosis is consistent with musculoskeletal pain. Vital signs and exam are reassuring. No indication of infection on urinalysis. It will be sent for culture. Patient will be discharged home with prescriptions for Flexeril and naproxen. Patient is to follow up with PCP as directed. Patient is given ED precautions to return to the ED for any worsening or new symptoms.     ____________________________________________  FINAL CLINICAL IMPRESSION(S) / ED DIAGNOSES  Final diagnoses:  Musculoskeletal pain      NEW MEDICATIONS STARTED DURING THIS VISIT:  Discharge Medication List as of 02/13/2017  7:06 PM    START taking these medications   Details  cyclobenzaprine (FLEXERIL) 5 MG tablet Take 1 tablet (5 mg total) by mouth 3 (three) times daily as needed for muscle spasms., Starting Fri 02/13/2017, Until Fri 02/20/2017, Print    naproxen (NAPROSYN) 500 MG tablet Take 1 tablet (500 mg total) by mouth 2 (two) times daily with a meal., Starting Fri 02/13/2017, Until Sat 02/13/2018, Print  This chart was dictated using voice recognition software/Dragon. Despite best efforts to proofread, errors can occur which can change the meaning. Any change was purely unintentional.    Laban Emperor, PA-C 02/15/17 0026    Earleen [place], MD 02/16/17 1500

## 2017-02-15 LAB — URINE CULTURE

## 2017-04-21 ENCOUNTER — Other Ambulatory Visit (HOSPITAL_COMMUNITY): Payer: Self-pay | Admitting: Family Medicine

## 2017-04-21 DIAGNOSIS — D270 Benign neoplasm of right ovary: Secondary | ICD-10-CM

## 2017-04-24 ENCOUNTER — Encounter (HOSPITAL_COMMUNITY): Payer: Self-pay

## 2017-04-24 ENCOUNTER — Ambulatory Visit (HOSPITAL_COMMUNITY): Payer: BLUE CROSS/BLUE SHIELD

## 2017-07-12 ENCOUNTER — Emergency Department (HOSPITAL_COMMUNITY)
Admission: EM | Admit: 2017-07-12 | Discharge: 2017-07-12 | Disposition: A | Discharge status: Home / Self Care | Payer: BLUE CROSS/BLUE SHIELD | Attending: Emergency Medicine | Admitting: Emergency Medicine

## 2017-07-12 ENCOUNTER — Encounter (HOSPITAL_COMMUNITY): Payer: Self-pay | Admitting: Emergency Medicine

## 2017-07-12 DIAGNOSIS — Z79899 Other long term (current) drug therapy: Secondary | ICD-10-CM | POA: Diagnosis not present

## 2017-07-12 DIAGNOSIS — K0889 Other specified disorders of teeth and supporting structures: Secondary | ICD-10-CM | POA: Insufficient documentation

## 2017-07-12 DIAGNOSIS — F17210 Nicotine dependence, cigarettes, uncomplicated: Secondary | ICD-10-CM | POA: Insufficient documentation

## 2017-07-12 DIAGNOSIS — Z880 Allergy status to penicillin: Secondary | ICD-10-CM | POA: Diagnosis not present

## 2017-07-12 MED ORDER — CLINDAMYCIN HCL 150 MG PO CAPS: 450.0000 mg | ORAL_CAPSULE | Freq: Three times a day (TID) | ORAL | 0 refills | Status: AC

## 2017-07-12 MED ORDER — IBUPROFEN 400 MG PO TABS
ORAL_TABLET | ORAL | Status: AC
  Filled 2017-07-12: qty 1

## 2017-07-12 MED ORDER — IBUPROFEN 400 MG PO TABS
400.0000 mg | ORAL_TABLET | Freq: Once | ORAL | Status: AC | PRN
  Administered 2017-07-12: 400 mg via ORAL

## 2017-07-12 MED ORDER — CLINDAMYCIN HCL 150 MG PO CAPS
450.0000 mg | ORAL_CAPSULE | Freq: Once | ORAL | Status: AC
  Administered 2017-07-12: 450 mg via ORAL
  Filled 2017-07-12: qty 3

## 2017-07-12 MED ORDER — CHLORHEXIDINE GLUCONATE 0.12 % MT SOLN: 15.0000 mL | Freq: Two times a day (BID) | OROMUCOSAL | 0 refills | Status: DC

## 2017-07-12 NOTE — ED Triage Notes (Signed)
Pt c/o 10/10 lower dental pain, states she has brackets on but it started hurting on Friday with no relief. Pt use some dental jell with no relive.

## 2017-07-12 NOTE — ED Provider Notes (Signed)
[facility] EMERGENCY DEPARTMENT Provider Note   CSN: [id number] Arrival date & time: 07/12/17  2038     History   Chief Complaint Chief Complaint  Patient presents with  . Dental Pain    HPI Sandra Sparks is a 36 y.o. female Who presents a with chief complaint acute onset, progressively worsening midline lower dental pain. She states that pain is constant throbbing and aching with radiation to her entire jaw and up the side of her face. She denies throat tightness or difficulty swallowing although she says that the pain has resulted in decreased appetite. She states that she has been primarily only eating soft foods and soup. She has tried ibuprofen, Tylenol, and Orajel with little to no relief of her symptoms. She does have a dentist whom she called and left a voicemail and is awaiting follow-up. She denies any trauma to the mouth.denies fevers, chills, or shortness of breath.  The history is provided by the patient.    Past Medical History:  Diagnosis Date  . Ovarian cyst     There are no active problems to display for this patient.   Past Surgical History:  Procedure Laterality Date  . BACK SURGERY      OB History    No data available       Home Medications    Prior to Admission medications   Medication Sig Start Date End Date Taking? Authorizing Provider  benzonatate (TESSALON) 200 MG capsule Take 1 capsule (200 mg total) by mouth 3 (three) times daily as needed for cough. Patient not taking: Reported on 01/10/2017 07/01/16   Sable Feil, PA-C  chlorhexidine (PERIDEX) 0.12 % solution Use as directed 15 mLs in the mouth or throat 2 (two) times daily. 07/12/17   Deborrah Mabin A, PA-C  chlorpheniramine-HYDROcodone (TUSSIONEX PENNKINETIC ER) 10-8 MG/5ML SUER Take 5 mLs by mouth at bedtime as needed for cough. Patient not taking: Reported on 01/10/2017 07/01/16   Sable Feil, PA-C  clindamycin (CLEOCIN) 150 MG capsule Take 3 capsules (450 mg  total) by mouth 3 (three) times daily. 07/12/17 07/19/17  Rodell Perna A, PA-C  ibuprofen (ADVIL,MOTRIN) 600 MG tablet Take 1 tablet (600 mg total) by mouth every 8 (eight) hours as needed. Patient not taking: Reported on 01/10/2017 07/01/16   Sable Feil, PA-C  naproxen (NAPROSYN) 500 MG tablet Take 1 tablet (500 mg total) by mouth 2 (two) times daily with a meal. 02/13/17 02/13/18  Laban Emperor, PA-C  oxyCODONE-acetaminophen (ROXICET) 5-325 MG tablet Take 1 tablet by mouth every 4 (four) hours as needed for severe pain. Patient not taking: Reported on 01/10/2017 10/13/15   Gregor Hams, MD  venlafaxine XR (EFFEXOR-XR) 37.5 MG 24 hr capsule Take 1 capsule (37.5 mg total) by mouth 2 (two) times daily. 01/10/17   Carrie Mew, MD    Family History No family history on file.  Social History Social History  Substance Use Topics  . Smoking status: Current Every Day Smoker    Packs/day: 0.50    Types: Cigarettes  . Smokeless tobacco: Never Used  . Alcohol use Yes     Comment: 3 x week     Allergies   Penicillins   Review of Systems Review of Systems  Constitutional: Negative for chills and fever.  HENT: Positive for dental problem. Negative for facial swelling, sore throat and trouble swallowing.   Respiratory: Negative for shortness of breath.   Cardiovascular: Negative for chest pain.  Physical Exam Updated Vital Signs BP (!) 139/91 (BP Location: Right Arm)   Pulse 82   Temp 98.1 F (36.7 C) (Oral)   Resp 18   Ht 5\' 5"  (1.651 m)   Wt 72.6 kg (160 lb)   LMP 07/06/2017   SpO2 99%   BMI 26.63 kg/m   Physical Exam  Constitutional: She appears well-developed and well-nourished. No distress.  HENT:  Head: Normocephalic and atraumatic.  Mouth/Throat:    Mildly decaying dentition with mild evidence of gingival irritation. She has tenderness to percussion of the lower central incisors. No significant swelling, no sublingual abnormalities or trismus. No facial  swelling noted. Posterior oropharynx unremarkable.  Eyes: Conjunctivae are normal. Right eye exhibits no discharge. Left eye exhibits no discharge.  Neck: Normal range of motion. Neck supple. No JVD present. No tracheal deviation present.  Cardiovascular: Normal rate.   Pulmonary/Chest: Effort normal.  Abdominal: She exhibits no distension.  Musculoskeletal: She exhibits no edema.  Neurological: She is alert.  Skin: No erythema.  Psychiatric: She has a normal mood and affect. Her behavior is normal.  Nursing note and vitals reviewed.    ED Treatments / Results  Labs (all labs ordered are listed, but only abnormal results are displayed) Labs Reviewed - No data to display  EKG  EKG Interpretation None       Radiology No results found.  Procedures Procedures (including critical care time)  Medications Ordered in ED Medications  clindamycin (CLEOCIN) capsule 450 mg (not administered)  ibuprofen (ADVIL,MOTRIN) tablet 400 mg (400 mg Oral Given 07/12/17 2053)     Initial Impression / Assessment and Plan / ED Course  I have reviewed the triage vital signs and the nursing notes.  Pertinent labs & imaging results that were available during my care of the patient were reviewed by me and considered in my medical decision making (see chart for details).     Dental pain associated with dental infection and possible dental abscess with patient afebrile, non toxic appearing and swallowing secretions well. I gave patient referral to dentist and stressed the importance of dental follow up for ultimate management of dental pain. She has a Pharmacist, community with whom she would like to follow up. I have also discussed reasons to return immediately to the ER.  Patient expresses understanding and agrees with plan. She will be given first dose of clindamycin in the ED. Will discharge with Peridex and clindamycin prescription as she has a penicillin allergy. Patient has no complaint prior to  discharge.  Final Clinical Impressions(s) / ED Diagnoses   Final diagnoses:  Pain, dental    New Prescriptions New Prescriptions   CHLORHEXIDINE (PERIDEX) 0.12 % SOLUTION    Use as directed 15 mLs in the mouth or throat 2 (two) times daily.   CLINDAMYCIN (CLEOCIN) 150 MG CAPSULE    Take 3 capsules (450 mg total) by mouth 3 (three) times daily.     Debroah Baller 07/12/17 2119    Daleen Bo, MD 07/13/17 [id number]

## 2017-07-12 NOTE — Discharge Instructions (Signed)
Please take all of your antibiotics until finished!   You may develop abdominal discomfort or diarrhea from the antibiotic.  You may help offset this with probiotics which you can buy or get in yogurt. Do not eat  or take the probiotics until 2 hours after your antibiotic.  ° °Apply warm compresses to jaw throughout the day. Alternate 600 mg of ibuprofen and 500-1000 mg of Tylenol every 3 hours as needed for pain. Do not exceed 4000 mg of Tylenol daily.  You may also use warm water salt gargles, Orajel, or other over-the-counter dental pain remedies. Use Peridex mouthwash twice daily. Followup with a dentist is very important for ongoing evaluation and management of recurrent dental pain. Return to emergency department for emergent changing or worsening symptoms such as fever, worsening facial swelling, difficulty breathing or swallowing, throat tightness, or vision changes. ° °

## 2017-07-12 NOTE — ED Notes (Signed)
Pt reports lower jaw pain that she feels is coming from her dental bracket.

## 2017-12-09 ENCOUNTER — Other Ambulatory Visit: Payer: Self-pay

## 2017-12-09 ENCOUNTER — Emergency Department: Payer: BLUE CROSS/BLUE SHIELD

## 2017-12-09 ENCOUNTER — Emergency Department
Admission: EM | Admit: 2017-12-09 | Discharge: 2017-12-09 | Disposition: A | Discharge status: Home / Self Care | Payer: BLUE CROSS/BLUE SHIELD | Attending: Student in an Organized Health Care Education/Training Program | Admitting: Student in an Organized Health Care Education/Training Program

## 2017-12-09 DIAGNOSIS — Z79899 Other long term (current) drug therapy: Secondary | ICD-10-CM | POA: Insufficient documentation

## 2017-12-09 DIAGNOSIS — F17210 Nicotine dependence, cigarettes, uncomplicated: Secondary | ICD-10-CM | POA: Diagnosis not present

## 2017-12-09 DIAGNOSIS — R200 Anesthesia of skin: Secondary | ICD-10-CM | POA: Insufficient documentation

## 2017-12-09 DIAGNOSIS — R0789 Other chest pain: Secondary | ICD-10-CM | POA: Insufficient documentation

## 2017-12-09 DIAGNOSIS — R079 Chest pain, unspecified: Secondary | ICD-10-CM

## 2017-12-09 LAB — BASIC METABOLIC PANEL
ANION GAP: 8 (ref 5–15)
BUN: 7 mg/dL (ref 6–20)
CALCIUM: 9 mg/dL (ref 8.9–10.3)
CO2: 26 mmol/L (ref 22–32)
Chloride: 104 mmol/L (ref 101–111)
Creatinine, Ser: 0.99 mg/dL (ref 0.44–1.00)
GFR calc Af Amer: >60 mL/min (ref >60)
GLUCOSE: 129 mg/dL — AB (ref 65–99)
Potassium: 3.5 mmol/L (ref 3.5–5.1)
Sodium: 138 mmol/L (ref 135–145)

## 2017-12-09 LAB — CBC
HCT: 39.5 % (ref 35.0–47.0)
HEMOGLOBIN: 12.7 g/dL (ref 12.0–16.0)
MCH: 27.4 pg (ref 26.0–34.0)
MCHC: 32.1 g/dL (ref 32.0–36.0)
MCV: 85.5 fL (ref 80.0–100.0)
Platelets: 242 10*3/uL (ref 150–440)
RBC: 4.62 MIL/uL (ref 3.80–5.20)
RDW: 17.5 % — AB (ref 11.5–14.5)
WBC: 4.6 10*3/uL (ref 3.6–11.0)

## 2017-12-09 LAB — POCT PREGNANCY, URINE: PREG TEST UR: NEGATIVE

## 2017-12-09 LAB — TROPONIN I

## 2017-12-09 MED ORDER — HYDROCODONE-ACETAMINOPHEN 5-325 MG PO TABS
1.0000 | ORAL_TABLET | Freq: Once | ORAL | Status: AC
  Administered 2017-12-09: 1 via ORAL
  Filled 2017-12-09: qty 1

## 2017-12-09 MED ORDER — HYDROCODONE-ACETAMINOPHEN 5-325 MG PO TABS: 1.0000 | ORAL_TABLET | ORAL | 0 refills | Status: DC | PRN

## 2017-12-09 NOTE — ED Provider Notes (Signed)
[facility] Emergency Department Provider Note    First MD Initiated Contact with Patient 12/09/17 1320     (approximate)  I have reviewed the triage vital signs and the nursing notes.   HISTORY  Chief Complaint Numbness and Chest Pain    HPI Sandra Sparks is a 37 y.o. female chief complaint of right-sided chest wall pain and right arm numbness and leg numbness that occurred last night while she was sitting watching TV.  States that she still has throbbing right-sided chest pain.  No pain when taking a deep breath.  She does smoke.  Is not on birth control.  Denies any recent fevers, cough, nausea or vomiting.  Denies any personal history of cardiac disease, hypertension or diabetes.  No history of sickle cell.  No recent immobilization.  Denies any personal history of blood clots.  Past Medical History:  Diagnosis Date  . Ovarian cyst    No family history on file. Past Surgical History:  Procedure Laterality Date  . BACK SURGERY     There are no active problems to display for this patient.     Prior to Admission medications   Medication Sig Start Date End Date Taking? Authorizing Provider  benzonatate (TESSALON) 200 MG capsule Take 1 capsule (200 mg total) by mouth 3 (three) times daily as needed for cough. Patient not taking: Reported on 01/10/2017 07/01/16   Sable Feil, PA-C  chlorhexidine (PERIDEX) 0.12 % solution Use as directed 15 mLs in the mouth or throat 2 (two) times daily. 07/12/17   Fawze, Mina A, PA-C  chlorpheniramine-HYDROcodone (TUSSIONEX PENNKINETIC ER) 10-8 MG/5ML SUER Take 5 mLs by mouth at bedtime as needed for cough. Patient not taking: Reported on 01/10/2017 07/01/16   Sable Feil, PA-C  ibuprofen (ADVIL,MOTRIN) 600 MG tablet Take 1 tablet (600 mg total) by mouth every 8 (eight) hours as needed. Patient not taking: Reported on 01/10/2017 07/01/16   Sable Feil, PA-C  naproxen (NAPROSYN) 500 MG tablet Take 1 tablet  (500 mg total) by mouth 2 (two) times daily with a meal. 02/13/17 02/13/18  Laban Emperor, PA-C  oxyCODONE-acetaminophen (ROXICET) 5-325 MG tablet Take 1 tablet by mouth every 4 (four) hours as needed for severe pain. Patient not taking: Reported on 01/10/2017 10/13/15   Gregor Hams, MD  venlafaxine XR (EFFEXOR-XR) 37.5 MG 24 hr capsule Take 1 capsule (37.5 mg total) by mouth 2 (two) times daily. 01/10/17   Carrie Mew, MD    Allergies Penicillins    Social History Social History   Tobacco Use  . Smoking status: Current Every Day Smoker    Packs/day: 0.50    Types: Cigarettes  . Smokeless tobacco: Never Used  Substance Use Topics  . Alcohol use: Yes    Comment: 3 x week  . Drug use: Not Currently    Comment: pt denies, smells strongly of marijuana    Review of Systems Patient denies headaches, rhinorrhea, blurry vision, numbness, shortness of breath, chest pain, edema, cough, abdominal pain, nausea, vomiting, diarrhea, dysuria, fevers, rashes or hallucinations unless otherwise stated above in HPI. ____________________________________________   PHYSICAL EXAM:  VITAL SIGNS: Vitals:   12/09/17 1207  BP: (!) 102/54  Pulse: 69  Resp: 18  Temp: (!) 97.5 F (36.4 C)  SpO2: 100%    Constitutional: Alert and oriented. Well appearing and in no acute distress. Eyes: Conjunctivae are normal.  Head: Atraumatic. Nose: No congestion/rhinnorhea. Mouth/Throat: Mucous membranes are moist.   Neck: No  stridor. Painless ROM.  Cardiovascular: Normal rate, regular rhythm. Grossly normal heart sounds.  Good peripheral circulation. Respiratory: Normal respiratory effort.  No retractions. Lungs CTAB. Gastrointestinal: Soft and nontender. No distention. No abdominal bruits. No CVA tenderness. Genitourinary:  Musculoskeletal: No lower extremity tenderness nor edema.  No joint effusions. Neurologic:  Normal speech and language. No gross focal neurologic deficits are appreciated. No  facial droop Skin:  Skin is warm, dry and intact. No rash noted. Psychiatric: Mood and affect are normal. Speech and behavior are normal.  ____________________________________________   LABS (all labs ordered are listed, but only abnormal results are displayed)  Results for orders placed or performed during the hospital encounter of 12/09/17 (from the past 24 hour(s))  Basic metabolic panel     Status: Abnormal   Collection Time: 12/09/17 12:21 PM  Result Value Ref Range   Sodium 138 135 - 145 mmol/L   Potassium 3.5 3.5 - 5.1 mmol/L   Chloride 104 101 - 111 mmol/L   CO2 26 22 - 32 mmol/L   Glucose, Bld 129 (H) 65 - 99 mg/dL   BUN 7 6 - 20 mg/dL   Creatinine, Ser 0.99 0.44 - 1.00 mg/dL   Calcium 9.0 8.9 - 10.3 mg/dL   GFR calc non Af Amer >60 >60 mL/min   GFR calc Af Amer >60 >60 mL/min   Anion gap 8 5 - 15  CBC     Status: Abnormal   Collection Time: 12/09/17 12:21 PM  Result Value Ref Range   WBC 4.6 3.6 - 11.0 K/uL   RBC 4.62 3.80 - 5.20 MIL/uL   Hemoglobin 12.7 12.0 - 16.0 g/dL   HCT 39.5 35.0 - 47.0 %   MCV 85.5 80.0 - 100.0 fL   MCH 27.4 26.0 - 34.0 pg   MCHC 32.1 32.0 - 36.0 g/dL   RDW 17.5 (H) 11.5 - 14.5 %   Platelets 242 150 - 440 K/uL  Troponin I     Status: None   Collection Time: 12/09/17 12:21 PM  Result Value Ref Range   Troponin I <0.03 <0.03 ng/mL  Pregnancy, urine POC     Status: None   Collection Time: 12/09/17 12:32 PM  Result Value Ref Range   Preg Test, Ur NEGATIVE NEGATIVE   ____________________________________________  EKG My review and personal interpretation at Time: 12:13   Indication: chest pain  Rate: 63  Rhythm: sinus Axis: normal  Other: normal intervals, no stemi ____________________________________________  RADIOLOGY  I personally reviewed all radiographic images ordered to evaluate for the above acute complaints and reviewed radiology reports and findings.  These findings were personally discussed with the patient.  Please see  medical record for radiology report.  ____________________________________________   PROCEDURES  Procedure(s) performed:  Procedures    Critical Care performed: no ____________________________________________   INITIAL IMPRESSION / ASSESSMENT AND PLAN / ED COURSE  Pertinent labs & imaging results that were available during my care of the patient were reviewed by me and considered in my medical decision making (see chart for details).  DDX: ACS, pericarditis, esophagitis, boerhaaves, pe, dissection, pna, bronchitis, costochondritis   Korey Bayliss is a 37 y.o. who presents to the ED with chest pain as described above.  Patient's well-appearing and in no acute distress.  Vital signs stable.  No tachycardia or hypoxia.  She is low risk by Wells criteria and is PERC negative.  She is a heart score of 2 versus 3 based on subjectivity.  Will repeat enzyme to  further risk stratify.  Seems primarily musculoskeletal.  No evidence of pneumothorax consolidation or intra-abdominal process based on exam and radiographs.    Repeat enzyme negative.  Patient pain-free and in no acute distress.  Patient stable and appropriate for outpatient referral.  As part of my medical decision making, I reviewed the following data within the [place] notes reviewed and incorporated, Labs reviewed, notes from prior ED visits and [place] Controlled Substance Database   ____________________________________________   FINAL CLINICAL IMPRESSION(S) / ED DIAGNOSES  Final diagnoses:  Chest pain, unspecified type      NEW MEDICATIONS STARTED DURING THIS VISIT:  New Prescriptions   No medications on file     Note:  This document was prepared using Dragon voice recognition software and may include unintentional dictation errors.    Merlyn Lot, MD 12/09/17 1944

## 2017-12-09 NOTE — ED Triage Notes (Signed)
Pt c/o right sided numbness from arm into leg - also c/o chest pain on the right side that radiates into back - both symptoms started last night - denies shortness of breath and dizziness

## 2018-01-03 ENCOUNTER — Emergency Department
Admission: EM | Admit: 2018-01-03 | Discharge: 2018-01-03 | Disposition: A | Discharge status: Home / Self Care | Payer: BLUE CROSS/BLUE SHIELD | Attending: Emergency Medicine | Admitting: Emergency Medicine

## 2018-01-03 ENCOUNTER — Encounter: Payer: Self-pay | Admitting: Emergency Medicine

## 2018-01-03 ENCOUNTER — Emergency Department: Payer: BLUE CROSS/BLUE SHIELD

## 2018-01-03 ENCOUNTER — Other Ambulatory Visit: Payer: Self-pay

## 2018-01-03 DIAGNOSIS — Y999 Unspecified external cause status: Secondary | ICD-10-CM | POA: Diagnosis not present

## 2018-01-03 DIAGNOSIS — S0083XA Contusion of other part of head, initial encounter: Secondary | ICD-10-CM | POA: Diagnosis not present

## 2018-01-03 DIAGNOSIS — F17210 Nicotine dependence, cigarettes, uncomplicated: Secondary | ICD-10-CM | POA: Diagnosis not present

## 2018-01-03 DIAGNOSIS — G44319 Acute post-traumatic headache, not intractable: Secondary | ICD-10-CM | POA: Insufficient documentation

## 2018-01-03 DIAGNOSIS — S20212A Contusion of left front wall of thorax, initial encounter: Secondary | ICD-10-CM | POA: Insufficient documentation

## 2018-01-03 DIAGNOSIS — S299XXA Unspecified injury of thorax, initial encounter: Secondary | ICD-10-CM | POA: Diagnosis present

## 2018-01-03 DIAGNOSIS — Z79899 Other long term (current) drug therapy: Secondary | ICD-10-CM | POA: Diagnosis not present

## 2018-01-03 DIAGNOSIS — Y939 Activity, unspecified: Secondary | ICD-10-CM | POA: Diagnosis not present

## 2018-01-03 DIAGNOSIS — Y929 Unspecified place or not applicable: Secondary | ICD-10-CM | POA: Insufficient documentation

## 2018-01-03 MED ORDER — MELOXICAM 15 MG PO TABS: 15.0000 mg | ORAL_TABLET | Freq: Every day | ORAL | 0 refills | Status: DC

## 2018-01-03 MED ORDER — IBUPROFEN 600 MG PO TABS
600.0000 mg | ORAL_TABLET | Freq: Once | ORAL | Status: AC
  Administered 2018-01-03: 600 mg via ORAL
  Filled 2018-01-03: qty 1

## 2018-01-03 MED ORDER — METHOCARBAMOL 500 MG PO TABS: 500.0000 mg | ORAL_TABLET | Freq: Four times a day (QID) | ORAL | 0 refills | Status: DC

## 2018-01-03 NOTE — ED Notes (Signed)
See PA note. Patient up for discharge before nurse could assess
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
## 2018-05-28 ENCOUNTER — Other Ambulatory Visit (HOSPITAL_COMMUNITY): Payer: Self-pay | Admitting: Family Medicine

## 2018-05-28 DIAGNOSIS — D179 Benign lipomatous neoplasm, unspecified: Secondary | ICD-10-CM

## 2018-05-28 DIAGNOSIS — D1723 Benign lipomatous neoplasm of skin and subcutaneous tissue of right leg: Secondary | ICD-10-CM

## 2018-05-30 ENCOUNTER — Emergency Department (HOSPITAL_COMMUNITY)
Admission: EM | Admit: 2018-05-30 | Discharge: 2018-05-30 | Disposition: A | Discharge status: Home / Self Care | Payer: BLUE CROSS/BLUE SHIELD | Attending: Emergency Medicine | Admitting: Emergency Medicine

## 2018-05-30 ENCOUNTER — Emergency Department (HOSPITAL_COMMUNITY): Payer: BLUE CROSS/BLUE SHIELD

## 2018-05-30 ENCOUNTER — Encounter (HOSPITAL_COMMUNITY): Payer: Self-pay | Admitting: Emergency Medicine

## 2018-05-30 ENCOUNTER — Other Ambulatory Visit: Payer: Self-pay

## 2018-05-30 DIAGNOSIS — M545 Low back pain, unspecified: Secondary | ICD-10-CM

## 2018-05-30 DIAGNOSIS — F17210 Nicotine dependence, cigarettes, uncomplicated: Secondary | ICD-10-CM | POA: Insufficient documentation

## 2018-05-30 DIAGNOSIS — M549 Dorsalgia, unspecified: Secondary | ICD-10-CM | POA: Diagnosis present

## 2018-05-30 DIAGNOSIS — Z79899 Other long term (current) drug therapy: Secondary | ICD-10-CM | POA: Diagnosis not present

## 2018-05-30 LAB — POC URINE PREG, ED: Preg Test, Ur: NEGATIVE

## 2018-05-30 MED ORDER — HYDROCODONE-ACETAMINOPHEN 5-325 MG PO TABS
1.0000 | ORAL_TABLET | Freq: Once | ORAL | Status: AC
  Administered 2018-05-30: 1 via ORAL
  Filled 2018-05-30: qty 1

## 2018-05-30 MED ORDER — METHOCARBAMOL 500 MG PO TABS: 500.0000 mg | ORAL_TABLET | Freq: Two times a day (BID) | ORAL | 0 refills | Status: DC

## 2018-05-30 NOTE — Discharge Instructions (Addendum)
Evaluated today for back pain.  X-rays did not show any acute abnormalities.   I will discharge you with a muscle relaxer.  Please use with caution as this may cause excessive sleepiness.  Please do not drive while taking this medicine.  Please continue to take the naproxen you were previously prescribed.  Follow-up with your primary care provider if her symptoms do not resolve.  Return to the ED with any new or worsening symptoms:  Contact a doctor if: You have pain that does not go away with rest or medicine. You have worsening pain that goes down into your legs or buttocks. You have pain that does not get better in one week. You have pain at night. You lose weight. You have a fever or chills. Get help right away if: You cannot control when you poop (bowel movement) or pee (urinate). Your arms or legs feel weak. Your arms or legs lose feeling (numbness). You feel sick to your stomach (nauseous) or throw up (vomit). You have belly (abdominal) pain. You feel like you may pass out (faint).

## 2018-05-30 NOTE — ED Provider Notes (Signed)
[facility] EMERGENCY DEPARTMENT Provider Note   CSN: [id number] Arrival date & time: 05/30/18  4097     History   Chief Complaint Chief Complaint  Patient presents with  . Back Pain    HPI Sandra Sparks is a 37 y.o. female with no known significant medical history who presents for evaluation of back pain.  Patient states she was at work last Monday when the pain began.  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  Pain radiates across her lower back.  Describes her pain as tight and spasms.  Was seen by urgent care on Thursday.  They prescribed her naproxen which she states does not work.  They also prescribed her a muscle relaxer once a day to use at night to help her to sleep.  Patient states that this helps her sleep at night however they only gave her 1 pill/day.  States that she is unable to work secondary to pain.  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  Denies radiation of pain. Current pain a 5/10. Denies fever, chills, chest pain, shortness of breath, abdominal pain, numbness, tingling, radiation of pain down her legs or upper extremities, weakness, history of IV drug use history, unintended weight loss, malignancy, bowel or bladder incontinence, lateral paresthesias.  Per patient, she states she has had some sort of back surgery in the past, however does not recall if this was a spinal fusion.  HPI  Past Medical History:  Diagnosis Date  . Ovarian cyst     There are no active problems to display for this patient.   Past Surgical History:  Procedure Laterality Date  . BACK SURGERY       OB History   None      Home Medications    Prior to Admission medications   Medication Sig Start Date End Date Taking? Authorizing Provider  benzonatate (TESSALON) 200 MG capsule Take 1 capsule (200 mg total) by mouth 3 (three) times daily as needed for  cough. Patient not taking: Reported on 01/10/2017 07/01/16   Sable Feil, PA-C  chlorhexidine (PERIDEX) 0.12 % solution Use as directed 15 mLs in the mouth or throat 2 (two) times daily. 07/12/17   Fawze, Mina A, PA-C  chlorpheniramine-HYDROcodone (TUSSIONEX PENNKINETIC ER) 10-8 MG/5ML SUER Take 5 mLs by mouth at bedtime as needed for cough. Patient not taking: Reported on 01/10/2017 07/01/16   Sable Feil, PA-C  HYDROcodone-acetaminophen [facility]) 5-325 MG tablet Take 1 tablet by mouth every 4 (four) hours as needed for moderate pain. 12/09/17   Merlyn Lot, MD  ibuprofen (ADVIL,MOTRIN) 600 MG tablet Take 1 tablet (600 mg total) by mouth every 8 (eight) hours as needed. Patient not taking: Reported on 01/10/2017 07/01/16   Sable Feil, PA-C  meloxicam (MOBIC) 15 MG tablet Take 1 tablet (15 mg total) by mouth daily. 01/03/18   Cuthriell, Charline Bills, PA-C  methocarbamol (ROBAXIN) 500 MG tablet Take 1 tablet (500 mg total) by mouth 2 (two) times daily. 05/30/18   Henderly, Britni A, PA-C  oxyCODONE-acetaminophen (ROXICET) 5-325 MG tablet Take 1 tablet by mouth every 4 (four) hours as needed for severe pain. Patient not taking: Reported on 01/10/2017 10/13/15   Gregor Hams, MD  venlafaxine XR (EFFEXOR-XR) 75 MG 24 hr capsule Take 75 mg by mouth daily with breakfast.    [provider]    Family History No family history on file.  Social History Social History   Tobacco Use  . Smoking status: Current Every Day Smoker    Packs/day: 0.50    Types: Cigarettes  . Smokeless tobacco: Never Used  Substance Use Topics  . Alcohol use: Yes    Comment: 3 x week  . Drug use: Not Currently    Comment: pt denies, smells strongly of marijuana     Allergies   Penicillins   Review of Systems Review of Systems  ReView of systems negative unless otherwise stated in the HPI Physical Exam Updated Vital Signs BP 109/60 (BP Location: Right Arm)   Pulse 74   Temp 98.4 F  (36.9 C) (Oral)   Resp 20   Ht 5\' 6"  (1.676 m)   Wt 72.6 kg   LMP 05/18/2018   SpO2 100%   BMI 25.82 kg/m   Physical Exam  Physical Exam  Constitutional: Pt appears well-developed and well-nourished. No distress.  HENT:  Head: Normocephalic and atraumatic.  Mouth/Throat: Oropharynx is clear and moist. No oropharyngeal exudate.  Eyes: Conjunctivae are normal.  Neck: Normal range of motion. Neck supple.  Full ROM without pain  Cardiovascular: Normal rate, regular rhythm and intact distal pulses.   Pulmonary/Chest: Effort normal and breath sounds normal. No respiratory distress. Pt has no wheezes.  Abdominal: Soft. Pt exhibits no distension. There is no tenderness, rebound or guarding. No abd bruit or pulsatile mass Musculoskeletal:  Full range of motion of the T-spine and L-spine with flexion, hyperextension, and lateral flexion. No midline tenderness or stepoffs. No tenderness to palpation of the spinous processes of the T-spine or L-spine. Mild tenderness to palpation of the paraspinous muscles of the L-spine and thoracic spine. Negative straight leg raise. Lymphadenopathy:    Pt has no cervical adenopathy.  Neurological: Pt is alert. Pt has normal reflexes.  Reflex Scores:      Bicep reflexes are 2+ on the right side and 2+ on the left side.      Brachioradialis reflexes are 2+ on the right side and 2+ on the left side.      Patellar reflexes are 2+ on the right side and 2+ on the left side.      Achilles reflexes are 2+ on the right side and 2+ on the left side. Speech is clear and goal oriented, follows commands Normal 5/5 strength in upper and lower extremities bilaterally including dorsiflexion and plantar flexion, strong and equal grip strength Sensation normal to light and sharp touch Moves extremities without ataxia, coordination intact Normal gait Normal balance No Clonus Skin: Skin is warm and dry. No rash noted or lesions noted. Pt is not diaphoretic. No erythema,  ecchymosis,edema or warmth.  Psychiatric: Pt has a normal mood and affect. Behavior is normal.  Nursing note and vitals reviewed. ED Treatments / Results  Labs (all labs ordered are listed, but only abnormal results are displayed) Labs Reviewed  POC URINE PREG, ED    EKG None  Radiology Dg Thoracic Spine 2 View  Result Date: 05/30/2018 CLINICAL DATA:  Generalized back pain since Monday. EXAM: THORACIC SPINE 2 VIEWS COMPARISON:  None. FINDINGS: Mild levoscoliosis of the upper thoracic spine, possibly accentuated by patient positioning. No evidence of acute vertebral body subluxation. No evidence of fracture within the thoracic spine. No degenerative change. Visualized paravertebral soft tissues are unremarkable. IMPRESSION: 1. No acute findings. 2. Mild levoscoliosis of the upper thoracic spine which may be accentuated  by patient positioning. Electronically Signed   By: Franki Cabot M.D.   On: 05/30/2018 11:49   Dg Lumbar Spine 2-3 Views  Result Date: 05/30/2018 CLINICAL DATA:  Generalized back pain since Monday. EXAM: LUMBAR SPINE - 2-3 VIEW COMPARISON:  None. FINDINGS: There is no evidence of lumbar spine fracture. Alignment is normal. Intervertebral disc spaces are maintained. IMPRESSION: Negative. Electronically Signed   By: Franki Cabot M.D.   On: 05/30/2018 11:50    Procedures Procedures (including critical care time)  Medications Ordered in ED Medications  HYDROcodone-acetaminophen (NORCO/VICODIN) 5-325 MG per tablet 1 tablet (1 tablet Oral Given 05/30/18 1042)     Initial Impression / Assessment and Plan / ED Course  I have reviewed the triage vital signs and the nursing notes as well as past medical history.  Pertinent labs & imaging results that were available during my care of the patient were reviewed by me and considered in my medical decision making (see chart for details).  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  Was  seen by urgent care given naproxen which has not helped with her pain, was also given muscle relaxer to take at night to help her to sleep which she states works however was only given 1 pill/day.  No red flag symptoms.  Pain does not radiate.  History of spinal surgery, however patient does not know if this was a fusion.  Will obtain plain film thoracic and lumbar spine and reevaluate. Plain film without evidence of hardware from a spinal surgery.  On reevaluation patient states it was maybe a cyst she had removed from her back but is unsure. Pain Controlled in the ED.  Plain films without any acute abnormalities.  Given physical exam feel her pain is most likely musculoskeletal in nature.  Will DC home on Robaxin and have her continue with the naproxen she was taking.  Discussed with patient need for reevaluation if her pain does not resolve.  Discussed strict return precautions with patient.  Patient voiced understanding offered to write work note for patient however she states that she is unable to afford to stay out of work.  Discussed proper body mechanics with movement of patient's to assist with lessening her pain.  Patient voiced understanding of return precautions.  All questions answered    Final Clinical Impressions(s) / ED Diagnoses   Final diagnoses:  Acute bilateral low back pain without sciatica    ED Discharge Orders         Ordered    methocarbamol (ROBAXIN) 500 MG tablet  2 times daily     05/30/18 1200           Henderly, Britni A, PA-C 05/30/18 1204    Tegeler, Gwenyth Allegra, MD 05/30/18 1645

## 2018-05-30 NOTE — ED Notes (Signed)
Patient verbalized understanding of discharge instructions and denies any further needs or questions at this time. VS stable. Patient ambulatory with steady gait.  

## 2018-05-30 NOTE — ED Triage Notes (Signed)
Pt. Stated, Im having back pain since last Monday and I went to Dr. And gave 2 medication and they are not helping and Im in a lot of pain.

## 2018-06-02 ENCOUNTER — Ambulatory Visit (HOSPITAL_COMMUNITY)
Admission: RE | Admit: 2018-06-02 | Discharge: 2018-06-02 | Disposition: A | Discharge status: Home / Self Care | Payer: BLUE CROSS/BLUE SHIELD | Source: Ambulatory Visit | Attending: Family Medicine | Admitting: Family Medicine

## 2018-06-02 DIAGNOSIS — D1723 Benign lipomatous neoplasm of skin and subcutaneous tissue of right leg: Secondary | ICD-10-CM

## 2018-06-02 DIAGNOSIS — D179 Benign lipomatous neoplasm, unspecified: Secondary | ICD-10-CM | POA: Diagnosis not present

## 2018-06-28 ENCOUNTER — Other Ambulatory Visit: Payer: Self-pay | Admitting: Family Medicine

## 2018-06-28 ENCOUNTER — Other Ambulatory Visit (HOSPITAL_COMMUNITY): Payer: Self-pay | Admitting: Family Medicine

## 2018-06-28 DIAGNOSIS — D270 Benign neoplasm of right ovary: Secondary | ICD-10-CM

## 2018-07-02 ENCOUNTER — Encounter (HOSPITAL_COMMUNITY): Payer: Self-pay

## 2018-07-02 ENCOUNTER — Ambulatory Visit (HOSPITAL_COMMUNITY)
Admission: RE | Admit: 2018-07-02 | Discharge: 2018-07-02 | Disposition: A | Discharge status: Home / Self Care | Payer: BLUE CROSS/BLUE SHIELD | Source: Ambulatory Visit | Attending: Family Medicine | Admitting: Family Medicine

## 2018-10-21 DIAGNOSIS — F331 Major depressive disorder, recurrent, moderate: Secondary | ICD-10-CM | POA: Diagnosis not present

## 2018-11-24 ENCOUNTER — Encounter (HOSPITAL_COMMUNITY): Payer: Self-pay | Admitting: *Deleted

## 2018-11-24 ENCOUNTER — Emergency Department (HOSPITAL_COMMUNITY): Payer: BLUE CROSS/BLUE SHIELD

## 2018-11-24 ENCOUNTER — Other Ambulatory Visit: Payer: Self-pay

## 2018-11-24 ENCOUNTER — Emergency Department (HOSPITAL_COMMUNITY)
Admission: EM | Admit: 2018-11-24 | Discharge: 2018-11-24 | Disposition: A | Discharge status: Home / Self Care | Payer: BLUE CROSS/BLUE SHIELD | Attending: Emergency Medicine | Admitting: Emergency Medicine

## 2018-11-24 DIAGNOSIS — J101 Influenza due to other identified influenza virus with other respiratory manifestations: Secondary | ICD-10-CM | POA: Diagnosis not present

## 2018-11-24 DIAGNOSIS — J111 Influenza due to unidentified influenza virus with other respiratory manifestations: Secondary | ICD-10-CM

## 2018-11-24 DIAGNOSIS — R05 Cough: Secondary | ICD-10-CM | POA: Diagnosis not present

## 2018-11-24 DIAGNOSIS — F17210 Nicotine dependence, cigarettes, uncomplicated: Secondary | ICD-10-CM | POA: Diagnosis not present

## 2018-11-24 DIAGNOSIS — Z79899 Other long term (current) drug therapy: Secondary | ICD-10-CM | POA: Insufficient documentation

## 2018-11-24 DIAGNOSIS — R0602 Shortness of breath: Secondary | ICD-10-CM | POA: Diagnosis not present

## 2018-11-24 DIAGNOSIS — R0981 Nasal congestion: Secondary | ICD-10-CM | POA: Diagnosis present

## 2018-11-24 NOTE — ED Triage Notes (Signed)
Pt reports exposure to flu, having cough, possible fever, bodyaches since Friday.

## 2018-11-24 NOTE — Discharge Instructions (Signed)
Please read attached information. If you experience any new or worsening signs or symptoms please return to the emergency room for evaluation. Please follow-up with your primary care provider or specialist as discussed.  °

## 2018-11-24 NOTE — ED Provider Notes (Signed)
[place] [facility] EMERGENCY DEPARTMENT Provider Note   CSN: [id number] Arrival date & time: 11/24/18  0844    History   Chief Complaint Chief Complaint  Patient presents with  . Influenza    HPI Lisaanne Lawrie is a 38 y.o. female.     HPI   38 year old female presents today with complaints of respiratory infection.  Patient notes approximate 5 days ago she developed rhinorrhea nasal congestion nonproductive cough and body aches.  She notes intermittent fevers, none presently.  She notes that her son is sick with similar symptoms and has been diagnosed with influenza.  Patient notes she has had some minor shortness of breath.  She notes she is a smoker but has not smoked since the illness.  She denies any chronic health conditions including asthma or known COPD.  She notes that she did receive the influenza vaccine.  She has had no recent travel or exposure to anyone with recent travel.  Past Medical History:  Diagnosis Date  . Ovarian cyst     There are no active problems to display for this patient.   Past Surgical History:  Procedure Laterality Date  . BACK SURGERY       OB History   No obstetric history on file.      Home Medications    Prior to Admission medications   Medication Sig Start Date End Date Taking? Authorizing Provider  benzonatate (TESSALON) 200 MG capsule Take 1 capsule (200 mg total) by mouth 3 (three) times daily as needed for cough. Patient not taking: Reported on 01/10/2017 07/01/16   Sable Feil, PA-C  chlorhexidine (PERIDEX) 0.12 % solution Use as directed 15 mLs in the mouth or throat 2 (two) times daily. 07/12/17   Fawze, Mina A, PA-C  chlorpheniramine-HYDROcodone (TUSSIONEX PENNKINETIC ER) 10-8 MG/5ML SUER Take 5 mLs by mouth at bedtime as needed for cough. Patient not taking: Reported on 01/10/2017 07/01/16   Sable Feil, PA-C  HYDROcodone-acetaminophen [facility]) 5-325 MG tablet Take 1 tablet by mouth every 4 (four) hours  as needed for moderate pain. 12/09/17   Merlyn Lot, MD  ibuprofen (ADVIL,MOTRIN) 600 MG tablet Take 1 tablet (600 mg total) by mouth every 8 (eight) hours as needed. Patient not taking: Reported on 01/10/2017 07/01/16   Sable Feil, PA-C  meloxicam (MOBIC) 15 MG tablet Take 1 tablet (15 mg total) by mouth daily. 01/03/18   Cuthriell, Charline Bills, PA-C  methocarbamol (ROBAXIN) 500 MG tablet Take 1 tablet (500 mg total) by mouth 2 (two) times daily. 05/30/18   Henderly, Britni A, PA-C  oxyCODONE-acetaminophen (ROXICET) 5-325 MG tablet Take 1 tablet by mouth every 4 (four) hours as needed for severe pain. Patient not taking: Reported on 01/10/2017 10/13/15   Gregor Hams, MD  venlafaxine XR (EFFEXOR-XR) 75 MG 24 hr capsule Take 75 mg by mouth daily with breakfast.    [provider]    Family History History reviewed. No pertinent family history.  Social History Social History   Tobacco Use  . Smoking status: Current Every Day Smoker    Packs/day: 0.50    Types: Cigarettes  . Smokeless tobacco: Never Used  Substance Use Topics  . Alcohol use: Yes    Comment: 3 x week  . Drug use: Not Currently    Comment: pt denies, smells strongly of marijuana     Allergies   Penicillins   Review of Systems Review of Systems  All other systems reviewed and are negative.  Physical Exam Updated Vital Signs BP 126/76 (BP Location: Right Arm)   Pulse 86   Temp 98.8 F (37.1 C) (Oral)   Resp 18   LMP 11/03/2018   SpO2 100%   Physical Exam Vitals signs and nursing note reviewed.  Constitutional:      Appearance: She is well-developed.  HENT:     Head: Normocephalic and atraumatic.     Comments: Oropharynx clear no erythema exudate or swelling Eyes:     General: No scleral icterus.       Right eye: No discharge.        Left eye: No discharge.     Conjunctiva/sclera: Conjunctivae normal.     Pupils: Pupils are equal, round, and reactive to light.  Neck:      Musculoskeletal: Normal range of motion.     Vascular: No JVD.     Trachea: No tracheal deviation.  Pulmonary:     Effort: Pulmonary effort is normal. No respiratory distress.     Breath sounds: Normal breath sounds. No stridor. No wheezing, rhonchi or rales.  Neurological:     Mental Status: She is alert and oriented to person, place, and time.     Coordination: Coordination normal.  Psychiatric:        Behavior: Behavior normal.        Thought Content: Thought content normal.        Judgment: Judgment normal.      ED Treatments / Results  Labs (all labs ordered are listed, but only abnormal results are displayed) Labs Reviewed - No data to display  EKG None  Radiology Dg Chest 2 View  Result Date: 11/24/2018 CLINICAL DATA:  Cough and shortness of breath EXAM: CHEST - 2 VIEW COMPARISON:  January 03, 2018 FINDINGS: Lungs are clear. Heart size and pulmonary vascularity are normal. No adenopathy. No bone lesions. IMPRESSION: No edema or consolidation. Electronically Signed   By: Lowella Grip III M.D.   On: 11/24/2018 09:42    Procedures Procedures (including critical care time)  Medications Ordered in ED Medications - No data to display   Initial Impression / Assessment and Plan / ED Course  I have reviewed the triage vital signs and the nursing notes.  Pertinent labs & imaging results that were available during my care of the patient were reviewed by me and considered in my medical decision making (see chart for details).        38 year old female presents today with likely viral illness with suspicion for influenza.  Patient is outside of the treatment window, she has no signs of secondary bacterial infection at this time and is well-appearing no acute distress.  She will be discharged with outpatient follow-up and strict return precautions.  She verbalized understanding and agreement to today's plan had no further questions or concerns.  Final Clinical  Impressions(s) / ED Diagnoses   Final diagnoses:  Influenza    ED Discharge Orders    None       Francee Gentile 11/24/18 [place], Wenda Overland, MD 11/24/18 1112

## 2019-03-18 DIAGNOSIS — H5213 Myopia, bilateral: Secondary | ICD-10-CM | POA: Diagnosis not present

## 2019-04-05 DIAGNOSIS — B342 Coronavirus infection, unspecified: Secondary | ICD-10-CM | POA: Diagnosis not present
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
## 2019-04-30 DIAGNOSIS — Z20828 Contact with and (suspected) exposure to other viral communicable diseases: Secondary | ICD-10-CM | POA: Diagnosis not present

## 2019-05-02 ENCOUNTER — Other Ambulatory Visit: Payer: Self-pay

## 2019-05-02 DIAGNOSIS — Z20822 Contact with and (suspected) exposure to covid-19: Secondary | ICD-10-CM

## 2019-05-04 LAB — SPECIMEN STATUS REPORT

## 2019-05-04 LAB — NOVEL CORONAVIRUS, NAA: SARS-CoV-2, NAA: NOT DETECTED

## 2019-07-04 DIAGNOSIS — R102 Pelvic and perineal pain: Secondary | ICD-10-CM | POA: Diagnosis not present

## 2019-07-06 DIAGNOSIS — Z20828 Contact with and (suspected) exposure to other viral communicable diseases: Secondary | ICD-10-CM | POA: Diagnosis not present

## 2019-07-06 DIAGNOSIS — U071 COVID-19: Secondary | ICD-10-CM | POA: Diagnosis not present

## 2019-07-13 DIAGNOSIS — U071 COVID-19: Secondary | ICD-10-CM | POA: Diagnosis not present

## 2019-11-18 IMAGING — DX CHEST - 2 VIEW
2 series · 2 of 2 positions shown · non-contrast
Comparison: January 03, 2018

CLINICAL DATA: Cough and shortness of breath

EXAM:
CHEST - 2 VIEW

[chest pa]
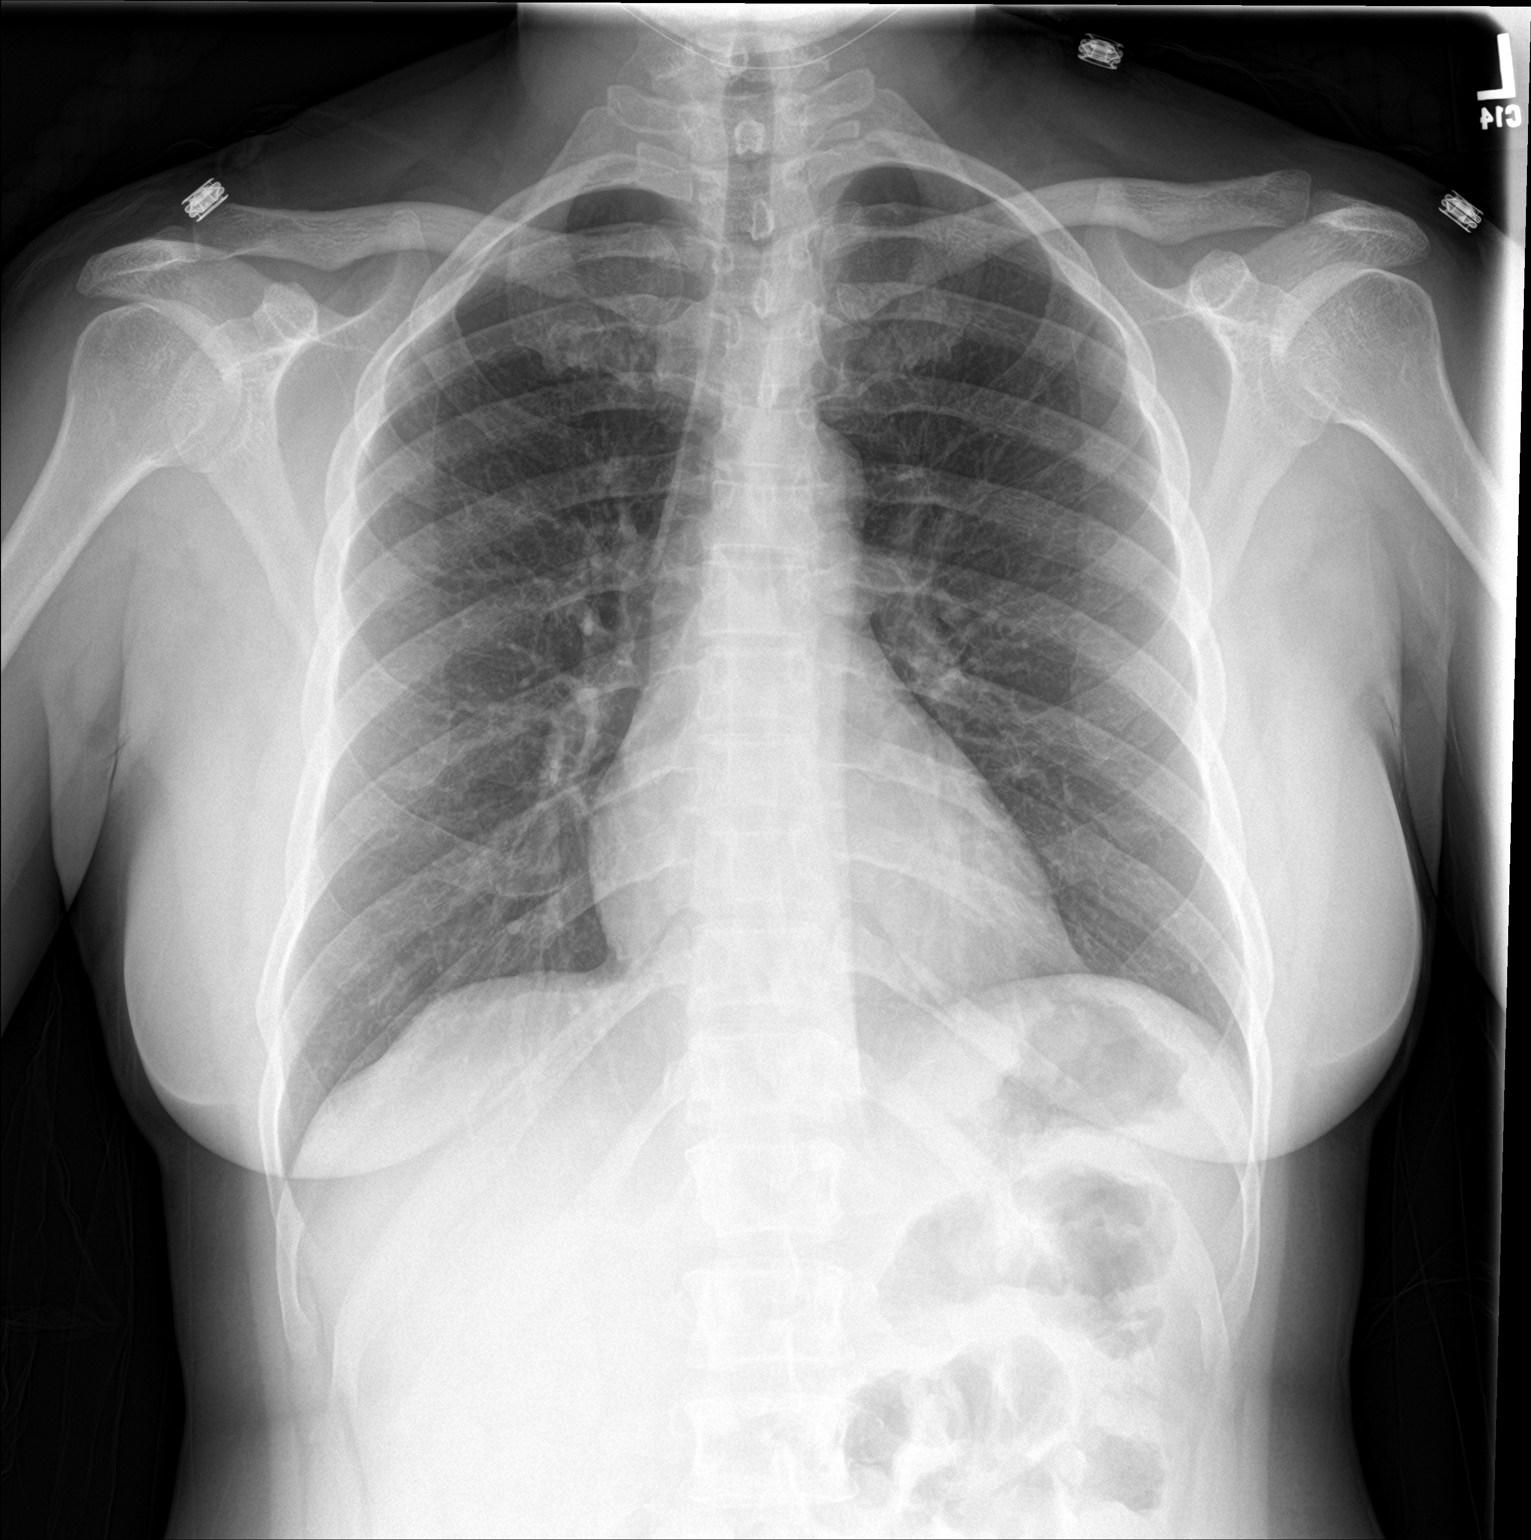

[chest lat]
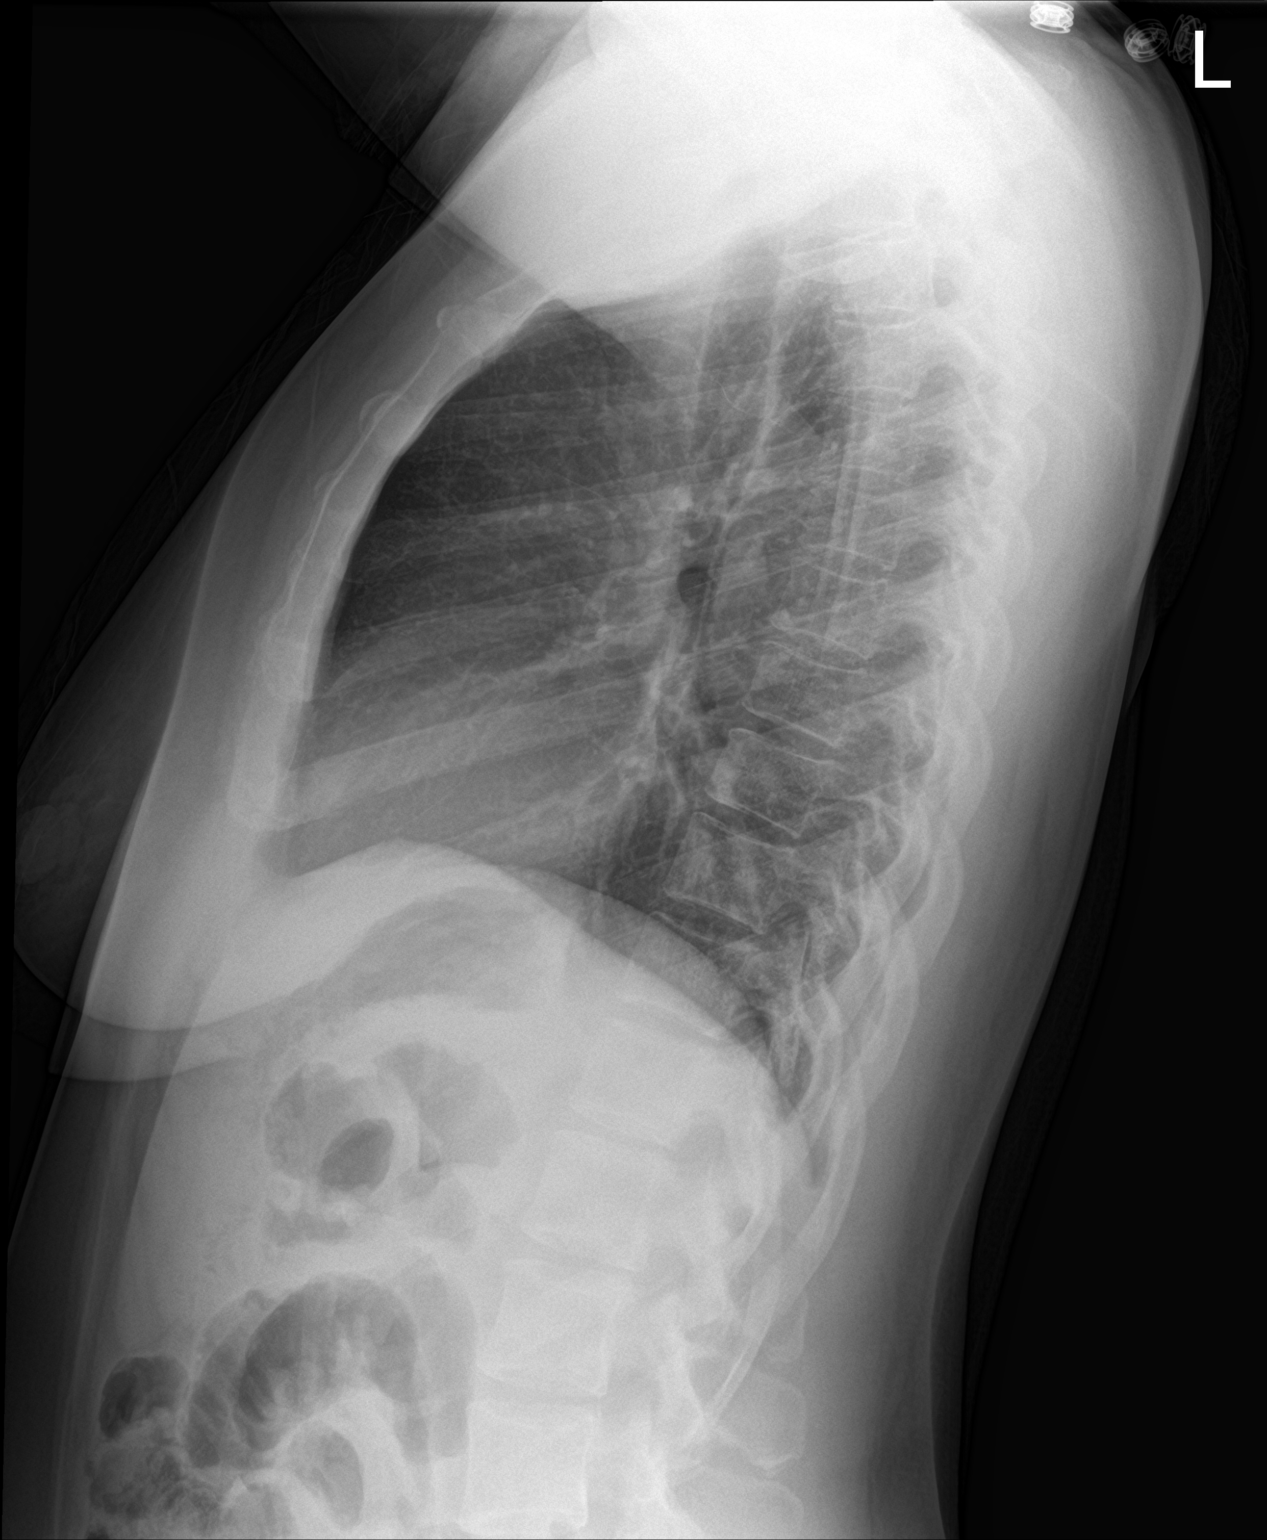

[2 of 2 positions shown; findings below may reference images not displayed]

FINDINGS: Lungs are clear. Heart size and pulmonary vascularity are normal. No
adenopathy. No bone lesions.
IMPRESSION: No edema or consolidation.

## 2020-01-03 DIAGNOSIS — F419 Anxiety disorder, unspecified: Secondary | ICD-10-CM | POA: Diagnosis not present

## 2020-01-03 DIAGNOSIS — F332 Major depressive disorder, recurrent severe without psychotic features: Secondary | ICD-10-CM | POA: Diagnosis not present

## 2020-01-05 ENCOUNTER — Other Ambulatory Visit: Payer: Self-pay

## 2020-01-06 ENCOUNTER — Ambulatory Visit: Payer: BC Managed Care – PPO | Admitting: Family Medicine

## 2020-01-06 ENCOUNTER — Encounter: Payer: Self-pay | Admitting: Family Medicine

## 2020-01-06 VITALS — BP 112/66 | HR 83 | Temp 98.0°F | Ht 65.0 in | Wt 176.8 lb

## 2020-01-06 DIAGNOSIS — N946 Dysmenorrhea, unspecified: Secondary | ICD-10-CM | POA: Insufficient documentation

## 2020-01-06 DIAGNOSIS — L700 Acne vulgaris: Secondary | ICD-10-CM | POA: Diagnosis not present

## 2020-01-06 DIAGNOSIS — Z113 Encounter for screening for infections with a predominantly sexual mode of transmission: Secondary | ICD-10-CM | POA: Insufficient documentation

## 2020-01-06 DIAGNOSIS — D1723 Benign lipomatous neoplasm of skin and subcutaneous tissue of right leg: Secondary | ICD-10-CM | POA: Diagnosis not present

## 2020-01-06 DIAGNOSIS — Z0000 Encounter for general adult medical examination without abnormal findings: Secondary | ICD-10-CM | POA: Insufficient documentation

## 2020-01-06 DIAGNOSIS — F4310 Post-traumatic stress disorder, unspecified: Secondary | ICD-10-CM | POA: Insufficient documentation

## 2020-01-06 NOTE — Patient Instructions (Signed)
Dysmenorrhea Dysmenorrhea means painful cramps during your period (menstrual period). You will have pain in your lower belly (abdomen). The pain is caused by the tightening (contracting) of the muscles of the womb (uterus). The pain may be mild or very bad. With this condition, you may:  Have a headache.  Feel sick to your stomach (nauseous).  Throw up (vomit).  Have lower back pain. Follow these instructions at home: Helping pain and cramping   Put heat on your lower back or belly when you have pain or cramps. Use the heat source that your doctor tells you to use. ? Place a towel between your skin and the heat. ? Leave the heat on for 20-30 minutes. ? Remove the heat if your skin turns bright red. This is especially important if you cannot feel pain, heat, or cold. ? Do not have a heating pad on during sleep.  Do aerobic exercises. These include walking, swimming, or biking. These may help with cramps.  Massage your lower back or belly. This may help lessen pain. General instructions  Take over-the-counter and prescription medicines only as told by your doctor.  Do not drive or use heavy machinery while taking prescription pain medicine.  Avoid alcohol and caffeine during and right before your period. These can make cramps worse.  Do not use any products that have nicotine or tobacco. These include cigarettes and e-cigarettes. If you need help quitting, ask your doctor.  Keep all follow-up visits as told by your doctor. This is important. Contact a doctor if:  You have pain that gets worse.  You have pain that does not get better with medicine.  You have pain during sex.  You feel sick to your stomach or you throw up during your period, and medicine does not help. Get help right away if:  You pass out (faint). Summary  Dysmenorrhea means painful cramps during your period (menstrual period).  Put heat on your lower back or belly when you have pain or cramps.  Do  exercises like walking, swimming, or biking to help with cramps.  Contact a doctor if you have pain during sex. This information is not intended to replace advice given to you by your health care provider. Make sure you discuss any questions you have with your health care provider. Document Revised: 08/14/2017 Document Reviewed: 09/18/2016 Elsevier Patient Education  [place].  Lipoma  A lipoma is a noncancerous (benign) tumor that is made up of fat cells. This is a very common type of soft-tissue growth. Lipomas are usually found under the skin (subcutaneous). They may occur in any tissue of the body that contains fat. Common areas for lipomas to appear include the back, arms, shoulders, buttocks, and thighs. Lipomas grow slowly, and they are usually painless. Most lipomas do not cause problems and do not require treatment. What are the causes? The cause of this condition is not known. What increases the risk? You are more likely to develop this condition if:  You are 45-31 years old.  You have a family history of lipomas. What are the signs or symptoms? A lipoma usually appears as a small, round bump under the skin. In most cases, the lump will:  Feel soft or rubbery.  Not cause pain or other symptoms. However, if a lipoma is located in an area where it pushes on nerves, it can become painful or cause other symptoms. How is this diagnosed? A lipoma can usually be diagnosed with a physical exam. You may  also have tests to confirm the diagnosis and to rule out other conditions. Tests may include:  Imaging tests, such as a CT scan or an MRI.  Removal of a tissue sample to be looked at under a microscope (biopsy). How is this treated? Treatment for this condition depends on the size of the lipoma and whether it is causing any symptoms.  For small lipomas that are not causing problems, no treatment is needed.  If a lipoma is bigger or it causes problems, surgery may be  done to remove the lipoma. Lipomas can also be removed to improve appearance. Most often, the procedure is done after applying a medicine that numbs the area (local anesthetic).  Liposuction may be done to reduce the size of the lipoma before it is removed through surgery, or it may be done to remove the lipoma. Lipomas are removed with this method in order to limit incision size and scarring. A liposuction tube is inserted through a small incision into the lipoma, and the contents of the lipoma are removed through the tube with suction. Follow these instructions at home:  Watch your lipoma for any changes.  Keep all follow-up visits as told by your health care provider. This is important. Contact a health care provider if:  Your lipoma becomes larger or hard.  Your lipoma becomes painful, red, or increasingly swollen. These could be signs of infection or a more serious condition. Get help right away if:  You develop tingling or numbness in an area near the lipoma. This could indicate that the lipoma is causing nerve damage. Summary  A lipoma is a noncancerous tumor that is made up of fat cells.  Most lipomas do not cause problems and do not require treatment.  If a lipoma is bigger or it causes problems, surgery may be done to remove the lipoma.  Contact a health care provider if your lipoma becomes larger or hard, or if it becomes painful, red, or increasingly swollen. Pain, redness, and swelling could be signs of infection or a more serious condition. This information is not intended to replace advice given to you by your health care provider. Make sure you discuss any questions you have with your health care provider. Document Revised: 04/18/2019 Document Reviewed: 04/18/2019 Elsevier Patient Education  [place] Maintenance, Female Adopting a healthy lifestyle and getting preventive care are important in promoting health and wellness. Ask your health care provider  about:  The right schedule for you to have regular tests and exams.  Things you can do on your own to prevent diseases and keep yourself healthy. What should I know about diet, weight, and exercise? Eat a healthy diet   Eat a diet that includes plenty of vegetables, fruits, low-fat dairy products, and lean protein.  Do not eat a lot of foods that are high in solid fats, added sugars, or sodium. Maintain a healthy weight Body mass index (BMI) is used to identify weight problems. It estimates body fat based on height and weight. Your health care provider can help determine your BMI and help you achieve or maintain a healthy weight. Get regular exercise Get regular exercise. This is one of the most important things you can do for your health. Most adults should:  Exercise for at least 150 minutes each week. The exercise should increase your heart rate and make you sweat (moderate-intensity exercise).  Do strengthening exercises at least twice a week. This is in addition to the moderate-intensity exercise.  Spend less time sitting. Even light physical activity can be beneficial. Watch cholesterol and blood lipids Have your blood tested for lipids and cholesterol at 41 years of age, then have this test every 5 years. Have your cholesterol levels checked more often if:  Your lipid or cholesterol levels are high.  You are older than 41 years of age.  You are at high risk for heart disease. What should I know about cancer screening? Depending on your health history and family history, you may need to have cancer screening at various ages. This may include screening for:  Breast cancer.  Cervical cancer.  Colorectal cancer.  Skin cancer.  Lung cancer. What should I know about heart disease, diabetes, and high blood pressure? Blood pressure and heart disease  High blood pressure causes heart disease and increases the risk of stroke. This is more likely to develop in people who  have high blood pressure readings, are of African descent, or are overweight.  Have your blood pressure checked: ? Every 3-5 years if you are 59-89 years of age. ? Every year if you are 78 years old or older. Diabetes Have regular diabetes screenings. This checks your fasting blood sugar level. Have the screening done:  Once every three years after age 20 if you are at a normal weight and have a low risk for diabetes.  More often and at a younger age if you are overweight or have a high risk for diabetes. What should I know about preventing infection? Hepatitis B If you have a higher risk for hepatitis B, you should be screened for this virus. Talk with your health care provider to find out if you are at risk for hepatitis B infection. Hepatitis C Testing is recommended for:  Everyone born from [birth year] through 1967.  Anyone with known risk factors for hepatitis C. Sexually transmitted infections (STIs)  Get screened for STIs, including gonorrhea and chlamydia, if: ? You are sexually active and are younger than 41 years of age. ? You are older than 41 years of age and your health care provider tells you that you are at risk for this type of infection. ? Your sexual activity has changed since you were last screened, and you are at increased risk for chlamydia or gonorrhea. Ask your health care provider if you are at risk.  Ask your health care provider about whether you are at high risk for HIV. Your health care provider may recommend a prescription medicine to help prevent HIV infection. If you choose to take medicine to prevent HIV, you should first get tested for HIV. You should then be tested every 3 months for as long as you are taking the medicine. Pregnancy  If you are about to stop having your period (premenopausal) and you may become pregnant, seek counseling before you get pregnant.  Take 400 to 800 micrograms (mcg) of folic acid every day if you become pregnant.  Ask for birth  control (contraception) if you want to prevent pregnancy. Osteoporosis and menopause Osteoporosis is a disease in which the bones lose minerals and strength with aging. This can result in bone fractures. If you are 47 years old or older, or if you are at risk for osteoporosis and fractures, ask your health care provider if you should:  Be screened for bone loss.  Take a calcium or vitamin D supplement to lower your risk of fractures.  Be given hormone replacement therapy (HRT) to treat symptoms of menopause. Follow these instructions at  home: Lifestyle  Do not use any products that contain nicotine or tobacco, such as cigarettes, e-cigarettes, and chewing tobacco. If you need help quitting, ask your health care provider.  Do not use street drugs.  Do not share needles.  Ask your health care provider for help if you need support or information about quitting drugs. Alcohol use  Do not drink alcohol if: ? Your health care provider tells you not to drink. ? You are pregnant, may be pregnant, or are planning to become pregnant.  If you drink alcohol: ? Limit how much you use to 0-1 drink a day. ? Limit intake if you are breastfeeding.  Be aware of how much alcohol is in your drink. In the [place] one drink equals one 12 oz bottle of beer (355 mL), one 5 oz glass of wine (148 mL), or one 1 oz glass of hard liquor (44 mL). General instructions  Schedule regular health, dental, and eye exams.  Stay current with your vaccines.  Tell your health care provider if: ? You often feel depressed. ? You have ever been abused or do not feel safe at home. Summary  Adopting a healthy lifestyle and getting preventive care are important in promoting health and wellness.  Follow your health care provider's instructions about healthy diet, exercising, and getting tested or screened for diseases.  Follow your health care provider's instructions on monitoring your cholesterol and blood  pressure. This information is not intended to replace advice given to you by your health care provider. Make sure you discuss any questions you have with your health care provider. Document Revised: 08/25/2018 Document Reviewed: 08/25/2018 Elsevier Patient Education  2020 [employer] Inc.  Preventive Care 52-72 Years Old, Female Preventive care refers to visits with your health care provider and lifestyle choices that can promote health and wellness. This includes:  A yearly physical exam. This may also be called an annual well check.  Regular dental visits and eye exams.  Immunizations.  Screening for certain conditions.  Healthy lifestyle choices, such as eating a healthy diet, getting regular exercise, not using drugs or products that contain nicotine and tobacco, and limiting alcohol use. What can I expect for my preventive care visit? Physical exam Your health care provider will check your:  Height and weight. This may be used to calculate body mass index (BMI), which tells if you are at a healthy weight.  Heart rate and blood pressure.  Skin for abnormal spots. Counseling Your health care provider may ask you questions about your:  Alcohol, tobacco, and drug use.  Emotional well-being.  Home and relationship well-being.  Sexual activity.  Eating habits.  Work and work Statistician.  Method of birth control.  Menstrual cycle.  Pregnancy history. What immunizations do I need?  Influenza (flu) vaccine  This is recommended every year. Tetanus, diphtheria, and pertussis (Tdap) vaccine  You may need a Td booster every 10 years. Varicella (chickenpox) vaccine  You may need this if you have not been vaccinated. Zoster (shingles) vaccine  You may need this after age 98. Measles, mumps, and rubella (MMR) vaccine  You may need at least one dose of MMR if you were born in 1957 or later. You may also need a second dose. Pneumococcal conjugate (PCV13)  vaccine  You may need this if you have certain conditions and were not previously vaccinated. Pneumococcal polysaccharide (PPSV23) vaccine  You may need one or two doses if you smoke cigarettes or if you have certain conditions. Meningococcal  conjugate (MenACWY) vaccine  You may need this if you have certain conditions. Hepatitis A vaccine  You may need this if you have certain conditions or if you travel or work in places where you may be exposed to hepatitis A. Hepatitis B vaccine  You may need this if you have certain conditions or if you travel or work in places where you may be exposed to hepatitis B. Haemophilus influenzae type b (Hib) vaccine  You may need this if you have certain conditions. Human papillomavirus (HPV) vaccine  If recommended by your health care provider, you may need three doses over 6 months. You may receive vaccines as individual doses or as more than one vaccine together in one shot (combination vaccines). Talk with your health care provider about the risks and benefits of combination vaccines. What tests do I need? Blood tests  Lipid and cholesterol levels. These may be checked every 5 years, or more frequently if you are over 81 years old.  Hepatitis C test.  Hepatitis B test. Screening  Lung cancer screening. You may have this screening every year starting at age 82 if you have a 30-pack-year history of smoking and currently smoke or have quit within the past 15 years.  Colorectal cancer screening. All adults should have this screening starting at age 83 and continuing until age 16. Your health care provider may recommend screening at age 95 if you are at increased risk. You will have tests every 1-10 years, depending on your results and the type of screening test.  Diabetes screening. This is done by checking your blood sugar (glucose) after you have not eaten for a while (fasting). You may have this done every 1-3 years.  Mammogram. This may be  done every 1-2 years. Talk with your health care provider about when you should start having regular mammograms. This may depend on whether you have a family history of breast cancer.  BRCA-related cancer screening. This may be done if you have a family history of breast, ovarian, tubal, or peritoneal cancers.  Pelvic exam and Pap test. This may be done every 3 years starting at age 56. Starting at age 38, this may be done every 5 years if you have a Pap test in combination with an HPV test. Other tests  Sexually transmitted disease (STD) testing.  Bone density scan. This is done to screen for osteoporosis. You may have this scan if you are at high risk for osteoporosis. Follow these instructions at home: Eating and drinking  Eat a diet that includes fresh fruits and vegetables, whole grains, lean protein, and low-fat dairy.  Take vitamin and mineral supplements as recommended by your health care provider.  Do not drink alcohol if: ? Your health care provider tells you not to drink. ? You are pregnant, may be pregnant, or are planning to become pregnant.  If you drink alcohol: ? Limit how much you have to 0-1 drink a day. ? Be aware of how much alcohol is in your drink. In the [place] one drink equals one 12 oz bottle of beer (355 mL), one 5 oz glass of wine (148 mL), or one 1 oz glass of hard liquor (44 mL). Lifestyle  Take daily care of your teeth and gums.  Stay active. Exercise for at least 30 minutes on 5 or more days each week.  Do not use any products that contain nicotine or tobacco, such as cigarettes, e-cigarettes, and chewing tobacco. If you need help quitting, ask your  health care provider.  If you are sexually active, practice safe sex. Use a condom or other form of birth control (contraception) in order to prevent pregnancy and STIs (sexually transmitted infections).  If told by your health care provider, take low-dose aspirin daily starting at age 57. What's  next?  Visit your health care provider once a year for a well check visit.  Ask your health care provider how often you should have your eyes and teeth checked.  Stay up to date on all vaccines. This information is not intended to replace advice given to you by your health care provider. Make sure you discuss any questions you have with your health care provider. Document Revised: 05/13/2018 Document Reviewed: 05/13/2018 Elsevier Patient Education  2020 [place].  Acne  Acne is a skin problem that causes small, red bumps (pimples) and other skin changes. The skin has tiny holes called pores. Each pore has an oil gland. Acne happens when the pores get blocked. The pores may become red, sore, and swollen. They may also become infected. Acne is common among teenagers. Acne usually goes away over time. What are the causes? This condition may be caused when:  Oil glands get blocked by oil, dead skin cells, and dirt.  Bacteria that live in the oil glands increase in number and cause infection. Acne can start with changes in hormones. These changes can occur:  When children mature into their teens (adolescence).  When women get their period (menstrual cycle).  When women are pregnant. Some things can make acne worse. They include:  Cosmetics and hair products that have oil in them.  Stress.  Diseases that cause changes in hormones.  Some medicines.  Headbands, backpacks, or shoulder pads.  Being near certain oils and chemicals.  Foods that are high in sugars. These include dairy products, sweets, and chocolates. What increases the risk? You are more likely to develop this condition if:  You are a teenager.  You have a family history of acne. What are the signs or symptoms? Symptoms of this condition include:  Small, red bumps (pimples or papules).  Whiteheads.  Blackheads.  Small, pus-filled pimples (pustules).  Big, red pimples or pustules that feel  tender. Acne that is very bad can cause:  An abscess. This is an area that has pus.  Cysts. These are hard, painful sacs that have fluid.  Scars. These can happen after large pimples heal. How is this treated? Treatment for this condition depends on how bad your acne is. It may include:  Creams and lotions. These can: ? Keep the pores of your skin open. ? Prevent infections and swelling.  Medicines that treat infections (antibiotics). These can be put on your skin or taken as pills.  Pills that decrease the amount of oil in your skin.  Birth control pills.  Light or laser treatments.  Shots of medicine into the areas with acne.  Chemicals that cause the skin to peel.  Surgery. Follow these instructions at home: Good skin care is the most important thing you can do to treat your acne. Take care of your skin as told by your doctor. You may be told to do these things:  Wash your skin gently at least two times each day. You should also wash your skin: ? After you exercise. ? Before you go to bed.  Use mild soap.  Use a water-based skin moisturizer after you wash your skin.  Use a sunscreen or sunblock with SPF 30 or  greater. This is very important if you are using acne medicines.  Choose cosmetics that will not block your oil glands (are noncomedogenic). Medicines  Take over-the-counter and prescription medicines only as told by your doctor.  If you were prescribed an antibiotic medicine, use it or take it as told by your doctor. Do not stop using the antibiotic even if your acne gets better. General instructions  Keep your hair clean and off your face. Shampoo your hair on a regular basis. If you have oily hair, you may need to wash it every day.  Avoid wearing tight headbands or hats.  Avoid picking or squeezing your pimples. That can make your acne worse and cause it to scar.  Shave gently. Only shave when you have to.  Keep a food journal. This can help you  see if any foods are linked to your acne.  Keep all follow-up visits as told by your doctor. This is important. Contact a doctor if:  Your acne is not better after eight weeks.  Your acne gets worse.  You have a large area of skin that is red or tender.  You think that you are having side effects from any acne medicine. Summary  Acne is a skin problem that causes pimples. Acne is common among teenagers. Acne usually goes away over time.  Acne starts with changes in your hormones. Other causes include stress, diet, and some medicines.  Follow your doctor's instructions on how to take care of your skin. Good skin care is the most important thing you can do to treat your acne.  Take over-the-counter and prescription medicines only as told by your doctor.  Contact your doctor if you think that you are having side effects from any acne medicine. This information is not intended to replace advice given to you by your health care provider. Make sure you discuss any questions you have with your health care provider. Document Revised: 01/12/2018 Document Reviewed: 01/12/2018 Elsevier Patient Education  [place].
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
## 2020-01-24 ENCOUNTER — Other Ambulatory Visit: Payer: Self-pay

## 2020-01-25 ENCOUNTER — Other Ambulatory Visit: Payer: Self-pay

## 2020-01-25 ENCOUNTER — Encounter: Payer: Self-pay | Admitting: Nurse Practitioner

## 2020-01-25 ENCOUNTER — Other Ambulatory Visit (INDEPENDENT_AMBULATORY_CARE_PROVIDER_SITE_OTHER): Payer: BC Managed Care – PPO

## 2020-01-25 ENCOUNTER — Ambulatory Visit: Payer: BC Managed Care – PPO | Admitting: Nurse Practitioner

## 2020-01-25 VITALS — BP 124/80 | Ht 65.0 in | Wt 173.0 lb

## 2020-01-25 DIAGNOSIS — N83201 Unspecified ovarian cyst, right side: Secondary | ICD-10-CM

## 2020-01-25 DIAGNOSIS — N946 Dysmenorrhea, unspecified: Secondary | ICD-10-CM | POA: Diagnosis not present

## 2020-01-25 DIAGNOSIS — Z0000 Encounter for general adult medical examination without abnormal findings: Secondary | ICD-10-CM | POA: Diagnosis not present

## 2020-01-25 DIAGNOSIS — Z01419 Encounter for gynecological examination (general) (routine) without abnormal findings: Secondary | ICD-10-CM | POA: Diagnosis not present

## 2020-01-25 LAB — URINALYSIS, ROUTINE W REFLEX MICROSCOPIC
Bilirubin Urine: NEGATIVE
Ketones, ur: NEGATIVE
Nitrite: NEGATIVE
Specific Gravity, Urine: 1.025 (ref 1.000–1.030)
Urine Glucose: NEGATIVE
Urobilinogen, UA: 0.2 (ref 0.0–1.0)
pH: 6 (ref 5.0–8.0)

## 2020-01-25 LAB — LIPID PANEL
Cholesterol: 212 mg/dL — ABNORMAL HIGH (ref 0–200)
HDL: 65.8 mg/dL (ref >39.00)
LDL Cholesterol: 118 mg/dL — ABNORMAL HIGH (ref 0–99)
NonHDL: 145.8
Total CHOL/HDL Ratio: 3
Triglycerides: 137 mg/dL (ref 0.0–149.0)
VLDL: 27.4 mg/dL (ref 0.0–40.0)

## 2020-01-25 LAB — COMPREHENSIVE METABOLIC PANEL
ALT: 9 U/L (ref 0–35)
AST: 2 U/L (ref 0–37)
Albumin: 4.1 g/dL (ref 3.5–5.2)
Alkaline Phosphatase: 61 U/L (ref 39–117)
BUN: 8 mg/dL (ref 6–23)
CO2: 28 mEq/L (ref 19–32)
Calcium: 8.8 mg/dL (ref 8.4–10.5)
Chloride: 104 mEq/L (ref 96–112)
Creatinine, Ser: 0.9 mg/dL (ref 0.40–1.20)
GFR: 83.35 mL/min (ref >60.00)
Glucose, Bld: 108 mg/dL — ABNORMAL HIGH (ref 70–99)
Potassium: 4.3 mEq/L (ref 3.5–5.1)
Sodium: 138 mEq/L (ref 135–145)
Total Bilirubin: 0.2 mg/dL (ref 0.2–1.2)
Total Protein: 6.6 g/dL (ref 6.0–8.3)

## 2020-01-25 LAB — CBC
HCT: 35.5 % — ABNORMAL LOW (ref 36.0–46.0)
Hemoglobin: 11.6 g/dL — ABNORMAL LOW (ref 12.0–15.0)
MCHC: 32.6 g/dL (ref 30.0–36.0)
MCV: 88.3 fl (ref 78.0–100.0)
Platelets: 312 10*3/uL (ref 150.0–400.0)
RBC: 4.02 Mil/uL (ref 3.87–5.11)
RDW: 14.6 % (ref 11.5–15.5)
WBC: 4.3 10*3/uL (ref 4.0–10.5)

## 2020-01-25 LAB — TSH: TSH: 0.47 u[IU]/mL (ref 0.35–4.50)

## 2020-01-25 NOTE — Progress Notes (Signed)
   Sandra Sparks 08/19/1981 [id number]   History:  39 y.o. SBF G7 P4 presents for evaluation of dysmenorrhea and to establish care.  Symptoms have worsened over the last year.  Pain is in lower abdomen and described as cramping, pain starts a few days before cycle and lasts until a few days after, says pain is severe to the point where she calls out of work, takes ibuprofen and Midol with minimal relief.  Complains of feeling fatigued during and after cycles.  Pelvic ultrasound 01/11/2016 showed a large left ovarian cyst noted on prior exam January 2017 has resolved, stable predominantly hyperechoic but complex right ovarian cyst is noted most consistent with dermoid cyst.  Not sexually active.  She is a smoker.  Recently establish care with PCP who made this referral.  Fasting labs were done this morning with PCP.  Gynecologic History Patient's last menstrual period was 01/20/2020. Period Pattern: (!) Irregular Menstrual Flow: Heavy Menstrual Control: Maxi pad, Tampon Dysmenorrhea: (!) Severe Dysmenorrhea Symptoms: Cramping Contraception: tubal ligation Last Pap: UNK Last mammogram: never  Past medical history, past surgical history, family history and social history were all reviewed and documented in the EPIC chart.  Works in a nursing home.  Has 4 sons.  ROS:  A ROS was performed and pertinent positives and negatives are included.  Exam:  Vitals:   01/25/20 1126  Weight: 173 lb (78.5 kg)  Height: 5\' 5"  (1.651 m)   Body mass index is 28.79 kg/m.  General appearance:  Normal Thyroid:  Symmetrical, normal in size, without palpable masses or nodularity. Respiratory  Auscultation:  Clear without wheezing or rhonchi Cardiovascular  Auscultation:  Regular rate, without rubs, murmurs or gallops  Edema/varicosities:  Not grossly evident Abdominal  Soft,nontender, without masses, guarding or rebound.  Liver/spleen:  No organomegaly noted  Hernia:  None appreciated  Skin  Inspection:   Grossly normal   Breasts: Examined lying and sitting.   Right: Without masses, retractions, discharge or axillary adenopathy.   Left: Without masses, retractions, discharge or axillary adenopathy. Gentitourinary   Inguinal/mons:  Normal without inguinal adenopathy  External genitalia:  Normal  BUS/Urethra/Skene's glands:  Normal  Vagina:  Normal  Cervix:  Normal  Uterus:  Normal in size, shape and contour.  Midline and mobile  Adnexa/parametria:     Rt: Without masses or tenderness.   Lt: Without masses or tenderness.  Anus and perineum: Normal    Assessment/Plan:  39 y.o. SBF G7P4 for annual exam.     Well female exam with routine gynecological exam - pap with HR HPV pending. Education provided on SBEs, importance of preventative screenings, current guidelines, high calcium diet, regular exercise, and multivitamin daily. Encouraged to schedule mammogram.   Dysmenorrhea - Plan: US PELVIS TRANSVAGINAL NON-OB (TV ONLY)  Right ovarian cyst - Plan: US PELVIS TRANSVAGINAL NON-OB (TV ONLY)  Will schedule ultrasound for next week        [place], 11:29 AM 01/25/2020

## 2020-01-25 NOTE — Patient Instructions (Addendum)
Health Maintenance, Female Adopting a healthy lifestyle and getting preventive care are important in promoting health and wellness. Ask your health care provider about:  The right schedule for you to have regular tests and exams.  Things you can do on your own to prevent diseases and keep yourself healthy. What should I know about diet, weight, and exercise? Eat a healthy diet   Eat a diet that includes plenty of vegetables, fruits, low-fat dairy products, and lean protein.  Do not eat a lot of foods that are high in solid fats, added sugars, or sodium. Maintain a healthy weight Body mass index (BMI) is used to identify weight problems. It estimates body fat based on height and weight. Your health care provider can help determine your BMI and help you achieve or maintain a healthy weight. Get regular exercise Get regular exercise. This is one of the most important things you can do for your health. Most adults should:  Exercise for at least 150 minutes each week. The exercise should increase your heart rate and make you sweat (moderate-intensity exercise).  Do strengthening exercises at least twice a week. This is in addition to the moderate-intensity exercise.  Spend less time sitting. Even light physical activity can be beneficial. Watch cholesterol and blood lipids Have your blood tested for lipids and cholesterol at 41 years of age, then have this test every 5 years. Have your cholesterol levels checked more often if:  Your lipid or cholesterol levels are high.  You are older than 40 years of age.  You are at high risk for heart disease. What should I know about cancer screening? Depending on your health history and family history, you may need to have cancer screening at various ages. This may include screening for:  Breast cancer.  Cervical cancer.  Colorectal cancer.  Skin cancer.  Lung cancer. What should I know about heart disease, diabetes, and high blood  pressure? Blood pressure and heart disease  High blood pressure causes heart disease and increases the risk of stroke. This is more likely to develop in people who have high blood pressure readings, are of African descent, or are overweight.  Have your blood pressure checked: ? Every 3-5 years if you are 18-39 years of age. ? Every year if you are 40 years old or older. Diabetes Have regular diabetes screenings. This checks your fasting blood sugar level. Have the screening done:  Once every three years after age 40 if you are at a normal weight and have a low risk for diabetes.  More often and at a younger age if you are overweight or have a high risk for diabetes. What should I know about preventing infection? Hepatitis B If you have a higher risk for hepatitis B, you should be screened for this virus. Talk with your health care provider to find out if you are at risk for hepatitis B infection. Hepatitis C Testing is recommended for:  Everyone born from 1945 through 1965.  Anyone with known risk factors for hepatitis C. Sexually transmitted infections (STIs)  Get screened for STIs, including gonorrhea and chlamydia, if: ? You are sexually active and are younger than 41 years of age. ? You are older than 41 years of age and your health care provider tells you that you are at risk for this type of infection. ? Your sexual activity has changed since you were last screened, and you are at increased risk for chlamydia or gonorrhea. Ask your health care provider if   you are at risk.  Ask your health care provider about whether you are at high risk for HIV. Your health care provider may recommend a prescription medicine to help prevent HIV infection. If you choose to take medicine to prevent HIV, you should first get tested for HIV. You should then be tested every 3 months for as long as you are taking the medicine. Pregnancy  If you are about to stop having your period (premenopausal) and  you may become pregnant, seek counseling before you get pregnant.  Take 400 to 800 micrograms (mcg) of folic acid every day if you become pregnant.  Ask for birth control (contraception) if you want to prevent pregnancy. Osteoporosis and menopause Osteoporosis is a disease in which the bones lose minerals and strength with aging. This can result in bone fractures. If you are 65 years old or older, or if you are at risk for osteoporosis and fractures, ask your health care provider if you should:  Be screened for bone loss.  Take a calcium or vitamin D supplement to lower your risk of fractures.  Be given hormone replacement therapy (HRT) to treat symptoms of menopause. Follow these instructions at home: Lifestyle  Do not use any products that contain nicotine or tobacco, such as cigarettes, e-cigarettes, and chewing tobacco. If you need help quitting, ask your health care provider.  Do not use street drugs.  Do not share needles.  Ask your health care provider for help if you need support or information about quitting drugs. Alcohol use  Do not drink alcohol if: ? Your health care provider tells you not to drink. ? You are pregnant, may be pregnant, or are planning to become pregnant.  If you drink alcohol: ? Limit how much you use to 0-1 drink a day. ? Limit intake if you are breastfeeding.  Be aware of how much alcohol is in your drink. In the [place] one drink equals one 12 oz bottle of beer (355 mL), one 5 oz glass of wine (148 mL), or one 1 oz glass of hard liquor (44 mL). General instructions  Schedule regular health, dental, and eye exams.  Stay current with your vaccines.  Tell your health care provider if: ? You often feel depressed. ? You have ever been abused or do not feel safe at home. Summary  Adopting a healthy lifestyle and getting preventive care are important in promoting health and wellness.  Follow your health care provider's instructions about healthy  diet, exercising, and getting tested or screened for diseases.  Follow your health care provider's instructions on monitoring your cholesterol and blood pressure. This information is not intended to replace advice given to you by your health care provider. Make sure you discuss any questions you have with your health care provider. Document Revised: 08/25/2018 Document Reviewed: 08/25/2018 Elsevier Patient Education  2020 [employer]  Dysmenorrhea Dysmenorrhea means painful cramps during your period (menstrual period). You will have pain in your lower belly (abdomen). The pain is caused by the tightening (contracting) of the muscles of the womb (uterus). The pain may be mild or very bad. With this condition, you may:  Have a headache.  Feel sick to your stomach (nauseous).  Throw up (vomit).  Have lower back pain. Follow these instructions at home: Helping pain and cramping   Put heat on your lower back or belly when you have pain or cramps. Use the heat source that your doctor tells you to use. ? Place a towel between   your skin and the heat. ? Leave the heat on for 20-30 minutes. ? Remove the heat if your skin turns bright red. This is especially important if you cannot feel pain, heat, or cold. ? Do not have a heating pad on during sleep.  Do aerobic exercises. These include walking, swimming, or biking. These may help with cramps.  Massage your lower back or belly. This may help lessen pain. General instructions  Take over-the-counter and prescription medicines only as told by your doctor.  Do not drive or use heavy machinery while taking prescription pain medicine.  Avoid alcohol and caffeine during and right before your period. These can make cramps worse.  Do not use any products that have nicotine or tobacco. These include cigarettes and e-cigarettes. If you need help quitting, ask your doctor.  Keep all follow-up visits as told by your doctor. This is  important. Contact a doctor if:  You have pain that gets worse.  You have pain that does not get better with medicine.  You have pain during sex.  You feel sick to your stomach or you throw up during your period, and medicine does not help. Get help right away if:  You pass out (faint). Summary  Dysmenorrhea means painful cramps during your period (menstrual period).  Put heat on your lower back or belly when you have pain or cramps.  Do exercises like walking, swimming, or biking to help with cramps.  Contact a doctor if you have pain during sex. This information is not intended to replace advice given to you by your health care provider. Make sure you discuss any questions you have with your health care provider. Document Revised: 08/14/2017 Document Reviewed: 09/18/2016 Elsevier Patient Education  2020 Elsevier Inc.  

## 2020-01-26 LAB — HIV ANTIBODY (ROUTINE TESTING W REFLEX): HIV 1&2 Ab, 4th Generation: NONREACTIVE

## 2020-01-27 LAB — PAP, TP IMAGING W/ HPV RNA, RFLX HPV TYPE 16,18/45: HPV DNA High Risk: NOT DETECTED

## 2020-02-02 ENCOUNTER — Ambulatory Visit (INDEPENDENT_AMBULATORY_CARE_PROVIDER_SITE_OTHER): Payer: BC Managed Care – PPO

## 2020-02-02 ENCOUNTER — Other Ambulatory Visit: Payer: Self-pay

## 2020-02-02 ENCOUNTER — Encounter: Payer: Self-pay | Admitting: Nurse Practitioner

## 2020-02-02 ENCOUNTER — Ambulatory Visit: Payer: BC Managed Care – PPO | Admitting: Nurse Practitioner

## 2020-02-02 VITALS — BP 120/80

## 2020-02-02 DIAGNOSIS — N920 Excessive and frequent menstruation with regular cycle: Secondary | ICD-10-CM

## 2020-02-02 DIAGNOSIS — N854 Malposition of uterus: Secondary | ICD-10-CM

## 2020-02-02 DIAGNOSIS — N946 Dysmenorrhea, unspecified: Secondary | ICD-10-CM

## 2020-02-02 DIAGNOSIS — N83201 Unspecified ovarian cyst, right side: Secondary | ICD-10-CM

## 2020-02-02 MED ORDER — NORGESTIMATE-ETH ESTRADIOL 0.25-35 MG-MCG PO TABS: 1.0000 | ORAL_TABLET | Freq: Every day | ORAL | 11 refills | Status: DC

## 2020-02-02 NOTE — Patient Instructions (Signed)

## 2020-02-02 NOTE — Progress Notes (Signed)
History: 39 year old presents for follow-up of ultrasound.  She was seen 01/24/2018 for dysmenorrhea that has worsened over the last year. Pain is in lower abdomen and described as cramping that begins a few days before cycle and lasts until a few days after.  Pain is so severe that she calls out of work.  Ibuprofen and Midol provide minimal relief.  Complains of fatigue with cycles.  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  Not sexually active. History of tubal ligation, considering reversal.   Exam: Ultrasound: Anteverted uterus, normal size and shape, no myometrial masses.  Symmetrical endometrium -9.6 mm, no masses or thickening seen.  Both ovaries normal size with normal follicle pattern and normal perfusion.  Small cyst right ovary with solid components along wall 16 x 14 mm -no significant change in size or appearance since previous scan in 2017.  No other adnexal masses.  No free fluid.  Assessment: Dysmenorrhea Menorrhagia with regular cycle  Plan: Reassurance provided on normal findings.  Discussed contraception options for management of painful, heavy menses.  Since she is considering reversal of tubal ligation we agreed to something short-term.  Start Ortho-Cyclen.  Written and verbal education provided on proper OCP use and low risk for blood clots.  Follow-up as needed if symptoms worsen or do not improve after few months on OCPs.  Patient agreeable to plan.

## 2020-03-06 DIAGNOSIS — F419 Anxiety disorder, unspecified: Secondary | ICD-10-CM | POA: Diagnosis not present

## 2020-03-06 DIAGNOSIS — F332 Major depressive disorder, recurrent severe without psychotic features: Secondary | ICD-10-CM | POA: Diagnosis not present

## 2020-03-15 ENCOUNTER — Ambulatory Visit: Payer: BC Managed Care – PPO | Admitting: Physician Assistant

## 2020-03-29 DIAGNOSIS — F419 Anxiety disorder, unspecified: Secondary | ICD-10-CM | POA: Diagnosis not present

## 2020-03-29 DIAGNOSIS — Z79899 Other long term (current) drug therapy: Secondary | ICD-10-CM | POA: Diagnosis not present

## 2020-03-29 DIAGNOSIS — F332 Major depressive disorder, recurrent severe without psychotic features: Secondary | ICD-10-CM | POA: Diagnosis not present

## 2020-04-05 ENCOUNTER — Ambulatory Visit: Payer: BC Managed Care – PPO | Admitting: Physician Assistant

## 2020-04-09 ENCOUNTER — Ambulatory Visit: Payer: BC Managed Care – PPO | Admitting: Physician Assistant

## 2020-04-10 DIAGNOSIS — F332 Major depressive disorder, recurrent severe without psychotic features: Secondary | ICD-10-CM | POA: Diagnosis not present

## 2020-04-10 DIAGNOSIS — F419 Anxiety disorder, unspecified: Secondary | ICD-10-CM | POA: Diagnosis not present

## 2020-04-24 DIAGNOSIS — F1220 Cannabis dependence, uncomplicated: Secondary | ICD-10-CM | POA: Diagnosis not present

## 2020-04-24 DIAGNOSIS — Z79899 Other long term (current) drug therapy: Secondary | ICD-10-CM | POA: Diagnosis not present

## 2020-04-24 DIAGNOSIS — F411 Generalized anxiety disorder: Secondary | ICD-10-CM | POA: Diagnosis not present

## 2020-04-24 DIAGNOSIS — F332 Major depressive disorder, recurrent severe without psychotic features: Secondary | ICD-10-CM | POA: Diagnosis not present

## 2020-04-26 DIAGNOSIS — F332 Major depressive disorder, recurrent severe without psychotic features: Secondary | ICD-10-CM | POA: Diagnosis not present

## 2020-04-26 DIAGNOSIS — Z79899 Other long term (current) drug therapy: Secondary | ICD-10-CM | POA: Diagnosis not present

## 2020-04-26 DIAGNOSIS — F411 Generalized anxiety disorder: Secondary | ICD-10-CM | POA: Diagnosis not present

## 2020-05-07 ENCOUNTER — Telehealth (INDEPENDENT_AMBULATORY_CARE_PROVIDER_SITE_OTHER): Payer: BC Managed Care – PPO | Admitting: Nurse Practitioner

## 2020-05-07 ENCOUNTER — Encounter: Payer: Self-pay | Admitting: Nurse Practitioner

## 2020-05-07 VITALS — Ht 65.0 in

## 2020-05-07 DIAGNOSIS — J069 Acute upper respiratory infection, unspecified: Secondary | ICD-10-CM | POA: Diagnosis not present

## 2020-05-07 MED ORDER — BENZONATATE 100 MG PO CAPS: 100.0000 mg | ORAL_CAPSULE | Freq: Three times a day (TID) | ORAL | 0 refills | Status: DC | PRN

## 2020-05-07 MED ORDER — CHLORPHEN-PE-ACETAMINOPHEN 4-10-325 MG PO TABS: 1.0000 | ORAL_TABLET | Freq: Two times a day (BID) | ORAL | 0 refills | Status: AC

## 2020-05-07 MED ORDER — ALBUTEROL SULFATE HFA 108 (90 BASE) MCG/ACT IN AERS: 1.0000 | INHALATION_SPRAY | Freq: Four times a day (QID) | RESPIRATORY_TRACT | 0 refills | Status: DC | PRN

## 2020-05-07 NOTE — Progress Notes (Signed)
Virtual Visit via Video Note  I connected with@ on 05/07/20 at  1:00 PM EDT by a video enabled telemedicine application and verified that I am speaking with the correct person using two identifiers.  Location: Patient:Home Provider: Office Participants: patient and provider   I discussed the limitations of evaluation and management by telemedicine and the availability of in person appointments. I also discussed with the patient that there may be a patient responsible charge related to this service. The patient expressed understanding and agreed to proceed.  CC:Pt c/o sore throat, headaches, fatigue, no appetite, and body aches x3 days. Pt states she had a fever of 101 on Friday 05/04/20  History of Present Illness: URI  This is a new problem. The current episode started in the past 7 days. The problem has been unchanged. The maximum temperature recorded prior to her arrival was 100.4 - 100.9 F. Associated symptoms include congestion, coughing, headaches, joint pain, rhinorrhea, sinus pain, sneezing, a sore throat and wheezing. Pertinent negatives include no diarrhea, dysuria, ear pain, joint swelling, nausea, neck pain, plugged ear sensation, rash, swollen glands or vomiting. She has tried acetaminophen and decongestant for the symptoms. The treatment provided no relief.  has completed COVID vaccine.  Observations/Objective: Physical Exam Vitals reviewed.  Constitutional:      General: She is not in acute distress.    Appearance: She is not diaphoretic.  HENT:     Mouth/Throat:     Mouth: Mucous membranes are moist.     Pharynx: Uvula midline.  Pulmonary:     Effort: Pulmonary effort is normal.  Musculoskeletal:     Cervical back: Normal range of motion and neck supple.  Neurological:     Mental Status: She is alert and oriented to person, place, and time.    Assessment and Plan: Jordane was seen today for sore throat.  Diagnoses and all orders for this visit:  Viral upper  respiratory tract infection -     benzonatate (TESSALON) 100 MG capsule; Take 1 capsule (100 mg total) by mouth 3 (three) times daily as needed for cough. -     Chlorphen-PE-Acetaminophen 4-10-325 MG TABS; Take 1 tablet by mouth every 12 (twelve) hours for 3 days. -     albuterol (VENTOLIN HFA) 108 (90 Base) MCG/ACT inhaler; Inhale 1-2 puffs into the lungs every 6 (six) hours as needed.   Follow Up Instructions: Schedule appt for COVID PCR test at retail pharmacy. Take above medications as prescribed. Go to urgent care clinic is no improvement in 3-5days Maintain adequate oral hydration.   I discussed the assessment and treatment plan with the patient. The patient was provided an opportunity to ask questions and all were answered. The patient agreed with the plan and demonstrated an understanding of the instructions.   The patient was advised to call back or seek an in-person evaluation if the symptoms worsen or if the condition fails to improve as anticipated.  Wilfred Lacy, NP

## 2020-05-07 NOTE — Patient Instructions (Signed)
Schedule appt for COVID PCR test at retail pharmacy. Take above medications as prescribed. Go to urgent care clinic is no improvement in 3-5days Maintain adequate oral hydration.

## 2020-05-08 DIAGNOSIS — Z79899 Other long term (current) drug therapy: Secondary | ICD-10-CM | POA: Diagnosis not present

## 2020-05-08 DIAGNOSIS — F1220 Cannabis dependence, uncomplicated: Secondary | ICD-10-CM | POA: Diagnosis not present

## 2020-05-08 DIAGNOSIS — Z20828 Contact with and (suspected) exposure to other viral communicable diseases: Secondary | ICD-10-CM | POA: Diagnosis not present

## 2020-05-08 DIAGNOSIS — F332 Major depressive disorder, recurrent severe without psychotic features: Secondary | ICD-10-CM | POA: Diagnosis not present

## 2020-05-08 DIAGNOSIS — F411 Generalized anxiety disorder: Secondary | ICD-10-CM | POA: Diagnosis not present

## 2020-05-22 DIAGNOSIS — F332 Major depressive disorder, recurrent severe without psychotic features: Secondary | ICD-10-CM | POA: Diagnosis not present

## 2020-05-22 DIAGNOSIS — Z79899 Other long term (current) drug therapy: Secondary | ICD-10-CM | POA: Diagnosis not present

## 2020-05-22 DIAGNOSIS — F411 Generalized anxiety disorder: Secondary | ICD-10-CM | POA: Diagnosis not present

## 2020-06-19 DIAGNOSIS — F1010 Alcohol abuse, uncomplicated: Secondary | ICD-10-CM | POA: Diagnosis not present

## 2020-06-19 DIAGNOSIS — F411 Generalized anxiety disorder: Secondary | ICD-10-CM | POA: Diagnosis not present

## 2020-06-19 DIAGNOSIS — F332 Major depressive disorder, recurrent severe without psychotic features: Secondary | ICD-10-CM | POA: Diagnosis not present

## 2020-06-19 DIAGNOSIS — Z79899 Other long term (current) drug therapy: Secondary | ICD-10-CM | POA: Diagnosis not present

## 2020-06-25 ENCOUNTER — Other Ambulatory Visit: Payer: Self-pay

## 2020-06-26 ENCOUNTER — Ambulatory Visit: Payer: BC Managed Care – PPO | Admitting: Nurse Practitioner

## 2020-06-26 ENCOUNTER — Encounter: Payer: Self-pay | Admitting: Nurse Practitioner

## 2020-06-26 VITALS — BP 120/86 | HR 92 | Temp 97.4°F | Ht 65.0 in | Wt 173.8 lb

## 2020-06-26 DIAGNOSIS — D369 Benign neoplasm, unspecified site: Secondary | ICD-10-CM | POA: Diagnosis not present

## 2020-06-26 MED ORDER — IBUPROFEN 600 MG PO TABS: 600.0000 mg | ORAL_TABLET | Freq: Three times a day (TID) | ORAL | 0 refills | Status: DC | PRN

## 2020-06-26 NOTE — Patient Instructions (Signed)
You will be contacted to schedule appt for [place] Use ibuprofen for pain Alternate between warm and cold compress as needed

## 2020-06-26 NOTE — Progress Notes (Signed)
Subjective:  Patient ID: Sandra Sparks, female    DOB: July 06, 1981  Age: 39 y.o. MRN: [id number]  CC: Acute Visit (Pt c/o knot on back x2 months or more. Pt states she had a similar knot in 2009 after the birth of her son and it was removed surgically. Pt states this knot is at the upper part of her back and she has been having migraines and difficulty sleeping due to the pain. )  Neck Pain  This is a chronic problem. The current episode started more than 1 year ago. The problem occurs intermittently. The problem has been gradually worsening. The pain is present in the occipital region and midline. The quality of the pain is described as aching. The pain is same all the time. Associated symptoms include headaches. Pertinent negatives include no numbness, pain with swallowing, paresis, photophobia, tingling, trouble swallowing, visual change or weakness. She has tried nothing for the symptoms.  she is concerned this is related to cyst. She has similar cyst located in lower back that caused similar symptoms. cyst was excised.  Reviewed past Medical, Social and Family history today.  Outpatient Medications Prior to Visit  Medication Sig Dispense Refill  . venlafaxine XR (EFFEXOR-XR) 75 MG 24 hr capsule Take 75 mg by mouth daily with breakfast.    . albuterol (VENTOLIN HFA) 108 (90 Base) MCG/ACT inhaler Inhale 1-2 puffs into the lungs every 6 (six) hours as needed. (Patient not taking: Reported on 06/26/2020) 6.7 g 0  . benzonatate (TESSALON) 100 MG capsule Take 1 capsule (100 mg total) by mouth 3 (three) times daily as needed for cough. (Patient not taking: Reported on 06/26/2020) 20 capsule 0  . norgestimate-ethinyl estradiol (ORTHO-CYCLEN) 0.25-35 MG-MCG tablet Take 1 tablet by mouth daily. (Patient not taking: Reported on 05/07/2020) 1 Package 11   No facility-administered medications prior to visit.    ROS See HPI  Objective:  BP 120/86 (BP Location: Left Arm, Patient Position: Sitting, Cuff  Size: Normal)   Pulse 92   Temp (!) 97.4 F (36.3 C) (Temporal)   Ht 5\' 5"  (1.651 m)   Wt 173 lb 12.8 oz (78.8 kg)   SpO2 99%   BMI 28.92 kg/m   Physical Exam Vitals reviewed.  Neck:     Thyroid: No thyromegaly.  Cardiovascular:     Pulses: Normal pulses.  Pulmonary:     Effort: Pulmonary effort is normal.  Musculoskeletal:     Right shoulder: Normal.     Left shoulder: Normal.     Cervical back: Normal range of motion and neck supple. Swelling and tenderness present. No erythema, rigidity or crepitus. Pain with movement present. Normal range of motion.       Back:  Lymphadenopathy:     Cervical: No cervical adenopathy.  Skin:    Findings: No erythema or rash.  Neurological:     Mental Status: She is alert and oriented to person, place, and time.    Assessment & Plan:  This visit occurred during the SARS-CoV-2 public health emergency.  Safety protocols were in place, including screening questions prior to the visit, additional usage of staff PPE, and extensive cleaning of exam room while observing appropriate contact time as indicated for disinfecting solutions.   Sandra Sparks was seen today for acute visit.  Diagnoses and all orders for this visit:  Dermoid cyst -     US SOFT TISSUE NECK; Future -     ibuprofen (ADVIL) 600 MG tablet; Take 1 tablet (600 mg total)  by mouth every 8 (eight) hours as needed (with food).   Problem List Items Addressed This Visit      Other   Dermoid cyst - Primary   Relevant Medications   ibuprofen (ADVIL) 600 MG tablet   Other Relevant Orders   US SOFT TISSUE NECK      Follow-up: No follow-ups on file.  Wilfred Lacy, NP

## 2020-06-28 ENCOUNTER — Encounter (HOSPITAL_BASED_OUTPATIENT_CLINIC_OR_DEPARTMENT_OTHER): Payer: Self-pay | Admitting: Emergency Medicine

## 2020-06-28 ENCOUNTER — Emergency Department (HOSPITAL_BASED_OUTPATIENT_CLINIC_OR_DEPARTMENT_OTHER)
Admission: EM | Admit: 2020-06-28 | Discharge: 2020-06-29 | Disposition: A | Discharge status: Home / Self Care | Payer: BC Managed Care – PPO | Attending: Emergency Medicine | Admitting: Emergency Medicine

## 2020-06-28 ENCOUNTER — Other Ambulatory Visit: Payer: Self-pay

## 2020-06-28 DIAGNOSIS — M7989 Other specified soft tissue disorders: Secondary | ICD-10-CM | POA: Insufficient documentation

## 2020-06-28 DIAGNOSIS — F17210 Nicotine dependence, cigarettes, uncomplicated: Secondary | ICD-10-CM | POA: Diagnosis not present

## 2020-06-28 NOTE — ED Triage Notes (Signed)
Patient presents with complaints of upper and lower mid back pain; denies any known trauma; states onset over a month ago but worse in the last week; states saw pcp this week and scheduled an [place] but states pain unbearable tonight.

## 2020-06-29 MED ORDER — HYDROCODONE-ACETAMINOPHEN 5-325 MG PO TABS: 1.0000 | ORAL_TABLET | Freq: Four times a day (QID) | ORAL | 0 refills | Status: DC | PRN

## 2020-06-29 NOTE — ED Notes (Signed)
Pt. Reports she had "tumor" removed from her lower back in Nov. Of 2009 that feels like it may be coming back.  Pt. Reports she is having pain in the upper back and lower back.  Pt. Is reporting the pain makes her feel awful.

## 2020-06-29 NOTE — ED Provider Notes (Signed)
[facility] Provider Note: Georgena Spurling, MD, FACEP  CSN: [id number] MRN: [id number] ARRIVAL: 06/28/20 at 2317 ROOM: [place]  Back Pain   HISTORY OF PRESENT ILLNESS  06/29/20 12:21 AM Markitta Nazaryan is a 39 y.o. female who has had a mass at the base of her neck posteriorly for about 2 months.  She was seen by her PCP 2 days ago and has an ultrasound of the lesion scheduled.  It is suspected as being a dermoid cyst as she had a lower spinal dermoid cyst removed several years ago.  She rates associated pain is a 10 out of 10, aching in nature and worse with movement or palpation.  There has been no erythema or drainage.   Past Medical History:  Diagnosis Date  . Depression   . Ovarian cyst     Past Surgical History:  Procedure Laterality Date  . BACK SURGERY      Family History  Problem Relation Age of Onset  . Diabetes Maternal Grandmother     Social History   Tobacco Use  . Smoking status: Current Every Day Smoker    Packs/day: 0.50    Types: Cigarettes  . Smokeless tobacco: Never Used  Vaping Use  . Vaping Use: Never used  Substance Use Topics  . Alcohol use: Yes    Comment: 3 x week  . Drug use: Not Currently    Prior to Admission medications   Medication Sig Start Date End Date Taking? Authorizing Provider  HYDROcodone-acetaminophen (NORCO) 5-325 MG tablet Take 1 tablet by mouth every 6 (six) hours as needed for severe pain. 06/29/20   Tkeya Stencil, MD  ibuprofen (ADVIL) 600 MG tablet Take 1 tablet (600 mg total) by mouth every 8 (eight) hours as needed (with food). 06/26/20   Nche, Charlene Brooke, NP  venlafaxine XR (EFFEXOR-XR) 75 MG 24 hr capsule Take 75 mg by mouth daily with breakfast.    [provider]    Allergies Penicillins   REVIEW OF SYSTEMS  Negative except as noted here or in the History of Present Illness.   PHYSICAL EXAMINATION  Initial Vital Signs Blood pressure 121/74, pulse 69, temperature  98.3 F (36.8 C), temperature source Oral, resp. rate 18, height 5\' 5"  (1.651 m), weight 78.8 kg, last menstrual period 06/09/2020, SpO2 100 %.  Examination General: Well-developed, well-nourished female in no acute distress; appearance consistent with age of record HENT: normocephalic; atraumatic Eyes: Normal appearance Neck: supple Heart: regular rate and rhythm Lungs: clear to auscultation bilaterally Abdomen: soft; nondistended; nontender Back: Tender, nonerythematous, nonfluctuant, nonindurated mass of the posterior lower neck Extremities: No deformity; full range of motion Neurologic: Awake, alert and oriented; motor function intact in all extremities and symmetric; no facial droop Skin: Warm and dry Psychiatric: Normal mood and affect   RESULTS  Summary of this visit's results, reviewed and interpreted by myself:   EKG Interpretation  Date/Time:    Ventricular Rate:    PR Interval:    QRS Duration:   QT Interval:    QTC Calculation:   R Axis:     Text Interpretation:        Laboratory Studies: No results found for this or any previous visit (from the past 24 hour(s)). Imaging Studies: No results found.  ED COURSE and MDM  Nursing notes, initial and subsequent vitals signs, including pulse oximetry, reviewed and interpreted by myself.  Vitals:   06/28/20 2331 06/28/20 2333  BP:  121/74  Pulse:  69  Resp:  18  Temp:  98.3 F (36.8 C)  TempSrc:  Oral  SpO2:  100%  Weight: 78.8 kg   Height: 5\' 5"  (1.651 m)    Medications - No data to display  Differential diagnosis includes epidermoid cyst and lipoma.  The lesion does not appear infected and is not well demarcated.  I do not believe surgical intervention in the ED is appropriate as this may be involving deep structures and has not been fully characterized yet by ultrasound.  We will treat her pain pending follow-up with her PCP.  PROCEDURES  Procedures   ED DIAGNOSES     ICD-10-CM   1. Mass of soft  tissue of neck  M79.89        Emett Stapel, Jenny Reichmann, MD 06/29/20 0030

## 2020-07-04 ENCOUNTER — Other Ambulatory Visit: Payer: Self-pay

## 2020-07-04 ENCOUNTER — Ambulatory Visit (HOSPITAL_BASED_OUTPATIENT_CLINIC_OR_DEPARTMENT_OTHER)
Admission: RE | Admit: 2020-07-04 | Discharge: 2020-07-04 | Disposition: A | Discharge status: Home / Self Care | Payer: BC Managed Care – PPO | Source: Ambulatory Visit | Attending: Nurse Practitioner | Admitting: Nurse Practitioner

## 2020-07-04 DIAGNOSIS — R221 Localized swelling, mass and lump, neck: Secondary | ICD-10-CM | POA: Diagnosis not present

## 2020-07-04 DIAGNOSIS — D369 Benign neoplasm, unspecified site: Secondary | ICD-10-CM | POA: Diagnosis not present

## 2020-07-10 ENCOUNTER — Telehealth: Payer: Self-pay | Admitting: Family Medicine

## 2020-07-10 DIAGNOSIS — F411 Generalized anxiety disorder: Secondary | ICD-10-CM | POA: Diagnosis not present

## 2020-07-10 DIAGNOSIS — F1010 Alcohol abuse, uncomplicated: Secondary | ICD-10-CM | POA: Diagnosis not present

## 2020-07-10 DIAGNOSIS — F1220 Cannabis dependence, uncomplicated: Secondary | ICD-10-CM | POA: Diagnosis not present

## 2020-07-10 DIAGNOSIS — F332 Major depressive disorder, recurrent severe without psychotic features: Secondary | ICD-10-CM | POA: Diagnosis not present

## 2020-07-10 NOTE — Telephone Encounter (Signed)
Patient is returning the call, please advise. CB is [phone]

## 2020-07-10 NOTE — Telephone Encounter (Signed)
Returned patients call, no answer unable to leave message will call back.

## 2020-07-10 NOTE — Telephone Encounter (Signed)
FYI: Spoke with patient who was calling for results from ultrasound that was ordered from Provider at ED. Patient verbally understood U/S of head and neck soft tissue was normal from results that we see in chart patient questioning results for her back advised patient that there were no test ran for patients back that I see in her chart. I asked patient about her call that she made to on call/after hours nurse with concerns about results and wanted to know the urgent matter that she wanted to discuss last week per patient she did not have a urgent matter she was just in pain and did not realize the conversation that she had with the after hours nurse but as of now there are no concerns.

## 2020-08-07 DIAGNOSIS — F332 Major depressive disorder, recurrent severe without psychotic features: Secondary | ICD-10-CM | POA: Diagnosis not present

## 2020-08-07 DIAGNOSIS — F1220 Cannabis dependence, uncomplicated: Secondary | ICD-10-CM | POA: Diagnosis not present

## 2020-08-07 DIAGNOSIS — F411 Generalized anxiety disorder: Secondary | ICD-10-CM | POA: Diagnosis not present

## 2020-08-07 DIAGNOSIS — F1010 Alcohol abuse, uncomplicated: Secondary | ICD-10-CM | POA: Diagnosis not present

## 2020-08-21 DIAGNOSIS — F1220 Cannabis dependence, uncomplicated: Secondary | ICD-10-CM | POA: Diagnosis not present

## 2020-08-21 DIAGNOSIS — Z79899 Other long term (current) drug therapy: Secondary | ICD-10-CM | POA: Diagnosis not present

## 2020-08-21 DIAGNOSIS — F411 Generalized anxiety disorder: Secondary | ICD-10-CM | POA: Diagnosis not present

## 2020-08-21 DIAGNOSIS — F332 Major depressive disorder, recurrent severe without psychotic features: Secondary | ICD-10-CM | POA: Diagnosis not present

## 2020-08-24 ENCOUNTER — Encounter: Payer: Self-pay | Admitting: Nurse Practitioner

## 2020-08-24 ENCOUNTER — Other Ambulatory Visit (HOSPITAL_COMMUNITY)
Admission: RE | Admit: 2020-08-24 | Discharge: 2020-08-24 | Disposition: A | Discharge status: Home / Self Care | Payer: BC Managed Care – PPO | Source: Ambulatory Visit | Attending: Nurse Practitioner | Admitting: Nurse Practitioner

## 2020-08-24 ENCOUNTER — Ambulatory Visit: Payer: BC Managed Care – PPO | Admitting: Nurse Practitioner

## 2020-08-24 ENCOUNTER — Other Ambulatory Visit: Payer: Self-pay

## 2020-08-24 VITALS — BP 114/80 | HR 90 | Temp 97.7°F | Ht 65.0 in | Wt 176.0 lb

## 2020-08-24 DIAGNOSIS — N760 Acute vaginitis: Secondary | ICD-10-CM | POA: Diagnosis not present

## 2020-08-24 DIAGNOSIS — Z113 Encounter for screening for infections with a predominantly sexual mode of transmission: Secondary | ICD-10-CM

## 2020-08-24 DIAGNOSIS — A5901 Trichomonal vulvovaginitis: Secondary | ICD-10-CM | POA: Diagnosis not present

## 2020-08-24 DIAGNOSIS — M79644 Pain in right finger(s): Secondary | ICD-10-CM | POA: Diagnosis not present

## 2020-08-24 MED ORDER — METRONIDAZOLE 500 MG PO TABS: 2000.0000 mg | ORAL_TABLET | Freq: Once | ORAL | 0 refills | Status: AC

## 2020-08-24 NOTE — Progress Notes (Signed)
Subjective:  Patient ID: Sandra Sparks, female    DOB: 12/29/1980  Age: 39 y.o. MRN: [id number]  CC: Acute Visit (Pt c/o right thumb pain and swelling x 4 days. Denies any known injury. Pt c/o vaginal discharge and odor x2 weeks. Pt states she took otc medication for yeast and it did not clear the symptoms. Denies itching, burning or pain. )  Hand Pain  The incident occurred 3 to 5 days ago. The injury mechanism is unknown. Pain location: right thumb. The quality of the pain is described as aching. The pain does not radiate. The pain has been constant since the incident. Pertinent negatives include no muscle weakness, numbness or tingling. The symptoms are aggravated by movement and palpation. She has tried nothing for the symptoms.  Vaginal Discharge The patient's primary symptoms include a genital odor and vaginal discharge. The patient's pertinent negatives include no genital itching, genital lesions, genital rash, missed menses, pelvic pain or vaginal bleeding. This is a new problem. The current episode started in the past 7 days. The problem occurs constantly. The problem has been unchanged. The patient is experiencing no pain. She is not pregnant. Pertinent negatives include no abdominal pain, chills, dysuria, flank pain, frequency or painful intercourse. The vaginal discharge was thin. The vaginal bleeding is typical of menses. She has not been passing clots. She has not been passing tissue. She has tried antifungals for the symptoms. The treatment provided no relief. She is sexually active. It is unknown whether or not her partner has an STD. She uses the rhythm method for contraception. Her menstrual history has been regular.   Reviewed past Medical, Social and Family history today.  Outpatient Medications Prior to Visit  Medication Sig Dispense Refill  . ibuprofen (ADVIL) 600 MG tablet Take 1 tablet (600 mg total) by mouth every 8 (eight) hours as needed (with food). 30 tablet 0  .  venlafaxine XR (EFFEXOR-XR) 75 MG 24 hr capsule Take 75 mg by mouth daily with breakfast.    . HYDROcodone-acetaminophen (NORCO) 5-325 MG tablet Take 1 tablet by mouth every 6 (six) hours as needed for severe pain. (Patient not taking: Reported on 08/24/2020) 12 tablet 0   No facility-administered medications prior to visit.    ROS See HPI  Objective:  BP 114/80 (BP Location: Left Arm, Patient Position: Sitting, Cuff Size: Normal)   Pulse 90   Temp 97.7 F (36.5 C) (Temporal)   Ht 5\' 5"  (1.651 m)   Wt 176 lb (79.8 kg)   SpO2 99%   BMI 29.29 kg/m   Physical Exam Vitals reviewed. Exam conducted with a chaperone present.  Genitourinary:    Labia:        Right: No rash or tenderness.        Left: No rash or tenderness.      Vagina: Vaginal discharge and erythema present. No tenderness.     Cervix: Discharge and erythema present. No cervical motion tenderness or friability.     Adnexa: Right adnexa normal and left adnexa normal.  Musculoskeletal:        General: Tenderness present. No swelling or deformity.     Right forearm: Normal.     Left forearm: Normal.     Right wrist: Normal.     Left wrist: Normal.     Right hand: Bony tenderness present. Normal strength. Normal sensation. There is no disruption of two-point discrimination. Normal capillary refill. Normal pulse.     Left hand: Normal.  Lymphadenopathy:  Lower Body: No right inguinal adenopathy. No left inguinal adenopathy.  Neurological:     Mental Status: She is alert and oriented to person, place, and time.    Assessment & Plan:  This visit occurred during the SARS-CoV-2 public health emergency.  Safety protocols were in place, including screening questions prior to the visit, additional usage of staff PPE, and extensive cleaning of exam room while observing appropriate contact time as indicated for disinfecting solutions.   Mishell was seen today for acute visit.  Diagnoses and all orders for this  visit:  Pain of right thumb  Acute vaginitis -     Cervicovaginal ancillary only( [place]) -     metroNIDAZOLE (FLAGYL) 500 MG tablet; Take 4 tablets (2,000 mg total) by mouth once for 1 dose.  Screen for STD (sexually transmitted disease) -     Cervicovaginal ancillary only( [place]) -     Cancel: HIV antibody (with reflex) -     Cancel: RPR -     RPR; Future -     HIV antibody (with reflex); Future  thumb pain probably due to contusion Use finger splint. Soak hand in warm water and epsom salt 2-3x/day. You will be contacted with lab appt.  Problem List Items Addressed This Visit   None   Visit Diagnoses    Pain of right thumb    -  Primary   Acute vaginitis       Relevant Medications   metroNIDAZOLE (FLAGYL) 500 MG tablet   Other Relevant Orders   Cervicovaginal ancillary only( [place])   Screen for STD (sexually transmitted disease)       Relevant Orders   Cervicovaginal ancillary only( [place])   RPR   HIV antibody (with reflex)      Follow-up: No follow-ups on file.  Wilfred Lacy, NP

## 2020-08-24 NOTE — Patient Instructions (Addendum)
thumb pain probably due to contusion Use finger splint. Soak hand in warm water and epsom salt 2-3x/day.  You will be contacted with lab appt.

## 2020-08-27 ENCOUNTER — Telehealth: Payer: Self-pay | Admitting: Nurse Practitioner

## 2020-08-27 DIAGNOSIS — N760 Acute vaginitis: Secondary | ICD-10-CM

## 2020-08-27 DIAGNOSIS — A5901 Trichomonal vulvovaginitis: Secondary | ICD-10-CM

## 2020-08-27 LAB — CERVICOVAGINAL ANCILLARY ONLY
Bacterial Vaginitis (gardnerella): POSITIVE — AB
Candida Glabrata: NEGATIVE
Candida Vaginitis: NEGATIVE
Chlamydia: NEGATIVE
Comment: NEGATIVE
Comment: NEGATIVE
Comment: NEGATIVE
Comment: NEGATIVE
Comment: NEGATIVE
Comment: NORMAL
Neisseria Gonorrhea: NEGATIVE
Trichomonas: POSITIVE — AB

## 2020-08-27 MED ORDER — METRONIDAZOLE 500 MG PO TABS: 500.0000 mg | ORAL_TABLET | Freq: Two times a day (BID) | ORAL | 0 refills | Status: DC

## 2020-08-27 NOTE — Telephone Encounter (Signed)
Sent additional metronidazole tabs. Schedule lab appt for repeat urine cytology in 1week Maintain abstinence till lab is repeated

## 2020-08-27 NOTE — Addendum Note (Signed)
Addended by: Wilfred Lacy L on: 08/27/2020 03:10 PM   Modules accepted: Orders

## 2020-08-28 NOTE — Telephone Encounter (Signed)
Patient notified and verbalized understanding °Lab appt scheduled °

## 2020-08-31 ENCOUNTER — Telehealth: Payer: Self-pay | Admitting: Family Medicine

## 2020-08-31 DIAGNOSIS — M79644 Pain in right finger(s): Secondary | ICD-10-CM

## 2020-08-31 MED ORDER — INDOMETHACIN 50 MG PO CAPS
50.0000 mg | ORAL_CAPSULE | Freq: Three times a day (TID) | ORAL | 1 refills | Status: DC
Start: 2020-08-31 — End: 2021-01-03

## 2020-08-31 NOTE — Telephone Encounter (Signed)
Pt calling about her recent appt with Baldo Ash and she was told to soak her finger in epsom salt and she said it hasnt worked and that the pain is bad enough for her not to be able to go to sleep, she said tylenol does not work, she said motrin does help some but she is taking it a lot. She said her finger is swollen really bad as well. She wants to know what she can do please advise. Callback [phone]

## 2020-08-31 NOTE — Telephone Encounter (Signed)
Patient notified VIA phone and agreeable to instructions/recommendations.  Dm/cma

## 2020-08-31 NOTE — Telephone Encounter (Signed)
Very hard to know without exam/evaluation but could be gout. Will send Rx for indomethacin and pt should take as direction. Stop taking any ibuprofen or aleve. If fever, chills, or no improvement in 3 days, pt needs appt wither at our office or UC.

## 2020-08-31 NOTE — Telephone Encounter (Signed)
Patient states that her thumb is very swollen and is throbbing.  Very painful to touch and has been using  Motrin and Tylenol with no relief.    Please advise.  Thanks. Dm/cma

## 2020-09-03 ENCOUNTER — Ambulatory Visit: Payer: BC Managed Care – PPO | Admitting: Family

## 2020-09-03 ENCOUNTER — Encounter: Payer: Self-pay | Admitting: Family

## 2020-09-03 ENCOUNTER — Other Ambulatory Visit: Payer: Self-pay

## 2020-09-03 ENCOUNTER — Other Ambulatory Visit (HOSPITAL_COMMUNITY)
Admission: RE | Admit: 2020-09-03 | Discharge: 2020-09-03 | Disposition: A | Discharge status: Home / Self Care | Payer: BC Managed Care – PPO | Source: Ambulatory Visit | Attending: Nurse Practitioner | Admitting: Nurse Practitioner

## 2020-09-03 VITALS — BP 116/74 | HR 87 | Temp 96.8°F | Ht 65.0 in | Wt 177.4 lb

## 2020-09-03 DIAGNOSIS — L03011 Cellulitis of right finger: Secondary | ICD-10-CM

## 2020-09-03 DIAGNOSIS — A5901 Trichomonal vulvovaginitis: Secondary | ICD-10-CM | POA: Diagnosis not present

## 2020-09-03 MED ORDER — TRAMADOL HCL 50 MG PO TABS: 50.0000 mg | ORAL_TABLET | Freq: Three times a day (TID) | ORAL | 0 refills | Status: DC | PRN

## 2020-09-03 MED ORDER — IBUPROFEN 800 MG PO TABS: 800.0000 mg | ORAL_TABLET | Freq: Three times a day (TID) | ORAL | 0 refills | Status: DC | PRN

## 2020-09-03 MED ORDER — SULFAMETHOXAZOLE-TRIMETHOPRIM 800-160 MG PO TABS: 1.0000 | ORAL_TABLET | Freq: Two times a day (BID) | ORAL | 0 refills | Status: DC

## 2020-09-03 NOTE — Patient Instructions (Signed)
Paronychia Paronychia is an infection of the skin. It happens near a fingernail or toenail. It may cause pain and swelling around the nail. In some cases, a fluid-filled bump (abscess) can form near or under the nail. Usually, this condition is not serious, and it clears up with treatment. Follow these instructions at home: Wound care  Keep the affected area clean.  Soak the fingers or toes in warm water as told by your doctor. You may be told to do this for 20 minutes, 2-3 times a day.  Keep the area dry when you are not soaking it.  Do not try to drain a fluid-filled bump on your own.  Follow instructions from your doctor about how to take care of the affected area. Make sure you: ? Wash your hands with soap and water before you change your bandage (dressing). If you cannot use soap and water, use hand sanitizer. ? Change your bandage as told by your doctor.  If you had a fluid-filled bump and your doctor drained it, check the area every day for signs of infection. Check for: ? Redness, swelling, or pain. ? Fluid or blood. ? Warmth. ? Pus or a bad smell. Medicines   Take over-the-counter and prescription medicines only as told by your doctor.  If you were prescribed an antibiotic medicine, take it as told by your doctor. Do not stop taking it even if you start to feel better. General instructions  Avoid touching any chemicals.  Do not pick at the affected area. Prevention  To prevent this condition from happening again: ? Wear rubber gloves when putting your hands in water for washing dishes or other tasks. ? Wear gloves if your hands might touch cleaners or chemicals. ? Avoid injuring your nails or fingertips. ? Do not bite your nails or tear hangnails. ? Do not cut your nails very short. ? Do not cut the skin at the base and sides of the nail (cuticles). ? Use clean nail clippers or scissors when trimming nails. Contact a doctor if:  You feel worse.  You do not get  better.  You have more fluid, blood, or pus coming from the affected area.  Your finger or knuckle is swollen or is hard to move. Get help right away if you have:  A fever or chills.  Redness spreading from the affected area.  Pain in a joint or muscle. Summary  Paronychia is an infection of the skin. It happens near a fingernail or toenail.  This condition may cause pain and swelling around the nail.  Soak the fingers or toes in warm water as told by your doctor.  Usually, this condition is not serious, and it clears up with treatment. This information is not intended to replace advice given to you by your health care provider. Make sure you discuss any questions you have with your health care provider. Document Revised: 09/18/2017 Document Reviewed: 09/14/2017 Elsevier Patient Education  2020 [employer]  

## 2020-09-03 NOTE — Progress Notes (Signed)
Acute Office Visit  Subjective:    Patient ID: Sandra Sparks, female    DOB: 01/24/1981, 39 y.o.   MRN: [id number]  Chief Complaint  Patient presents with  . Hand Pain    Thumb on right hand very painful medication have not helped. Patient would like something for the pain and states that the thumb looks infected. Patient seen last week for this issue.     HPI Patient is in today with c/o a painful and swollen right thumb x 4 days and worsening. She reports getting a manicure recently. Thumbs is a throbbing pain that prevents her from sleeping. Denies any know injury.   Past Medical History:  Diagnosis Date  . Depression   . Ovarian cyst     Past Surgical History:  Procedure Laterality Date  . BACK SURGERY      Family History  Problem Relation Age of Onset  . Diabetes Maternal Grandmother     Social History   Socioeconomic History  . Marital status: Single    Spouse name: Not on file  . Number of children: Not on file  . Years of education: Not on file  . Highest education level: Not on file  Occupational History  . Not on file  Tobacco Use  . Smoking status: Current Every Day Smoker    Packs/day: 0.50    Types: Cigarettes  . Smokeless tobacco: Never Used  Vaping Use  . Vaping Use: Never used  Substance and Sexual Activity  . Alcohol use: Yes    Comment: 3 x week  . Drug use: Not Currently  . Sexual activity: Not Currently    Birth control/protection: Surgical  Other Topics Concern  . Not on file  Social History Narrative  . Not on file   Social Determinants of Health   Financial Resource Strain: Not on file  Food Insecurity: Not on file  Transportation Needs: Not on file  Physical Activity: Not on file  Stress: Not on file  Social Connections: Not on file  Intimate Partner Violence: Not on file    Outpatient Medications Prior to Visit  Medication Sig Dispense Refill  . indomethacin (INDOCIN) 50 MG capsule Take 1 capsule (50 mg total) by mouth  3 (three) times daily with meals. 30 capsule 1  . venlafaxine XR (EFFEXOR-XR) 75 MG 24 hr capsule Take 75 mg by mouth daily with breakfast.    . HYDROcodone-acetaminophen (NORCO) 5-325 MG tablet Take 1 tablet by mouth every 6 (six) hours as needed for severe pain. (Patient not taking: No sig reported) 12 tablet 0  . metroNIDAZOLE (FLAGYL) 500 MG tablet Take 1 tablet (500 mg total) by mouth 2 (two) times daily. (Patient not taking: Reported on 09/03/2020) 10 tablet 0   No facility-administered medications prior to visit.    Allergies  Allergen Reactions  . Penicillins Other (See Comments)    Unsure     Review of Systems  Constitutional: Negative.   Respiratory: Negative.   Cardiovascular: Negative.   Musculoskeletal:       Right thumb painful and swollen.  Skin: Positive for wound.       Pus in the right thumb  Neurological: Negative.   Psychiatric/Behavioral: Negative.        Objective:    Physical Exam Cardiovascular:     Rate and Rhythm: Normal rate and regular rhythm.  Pulmonary:     Effort: Pulmonary effort is normal.     Breath sounds: Normal breath sounds.  Musculoskeletal:  Cervical back: Normal range of motion and neck supple.  Skin:    General: Skin is warm and dry.     Comments: Incision and Drainage: Under sterile technique, digital block used to anesthetize the right thumb. Small incision made to the distal thumb to expel a lab amount of pustular drainage. Patient tolerated the procedure well. Dressed and bandaged by the CMA  Neurological:     General: No focal deficit present.     Mental Status: She is alert and oriented to person, place, and time.  Psychiatric:        Mood and Affect: Mood normal.     BP 116/74   Pulse 87   Temp (!) 96.8 F (36 C) (Temporal)   Ht 5\' 5"  (1.651 m)   Wt 177 lb 6.4 oz (80.5 kg)   SpO2 97%   BMI 29.52 kg/m  Wt Readings from Last 3 Encounters:  09/03/20 177 lb 6.4 oz (80.5 kg)  08/24/20 176 lb (79.8 kg)   06/28/20 173 lb 11.6 oz (78.8 kg)    Health Maintenance Due  Topic Date Due  . Hepatitis C Screening  Never done    There are no preventive care reminders to display for this patient.   Lab Results  Component Value Date   TSH 0.47 01/25/2020   Lab Results  Component Value Date   WBC 4.3 01/25/2020   HGB 11.6 (L) 01/25/2020   HCT 35.5 (L) 01/25/2020   MCV 88.3 01/25/2020   PLT 312.0 01/25/2020   Lab Results  Component Value Date   NA 138 01/25/2020   K 4.3 01/25/2020   CO2 28 01/25/2020   GLUCOSE 108 (H) 01/25/2020   BUN 8 01/25/2020   CREATININE 0.90 01/25/2020   BILITOT 0.2 01/25/2020   ALKPHOS 61 01/25/2020   AST 2 01/25/2020   ALT 9 01/25/2020   PROT 6.6 01/25/2020   ALBUMIN 4.1 01/25/2020   CALCIUM 8.8 01/25/2020   ANIONGAP 8 12/09/2017   GFR 83.35 01/25/2020   Lab Results  Component Value Date   CHOL 212 (H) 01/25/2020   Lab Results  Component Value Date   HDL 65.80 01/25/2020   Lab Results  Component Value Date   LDLCALC 118 (H) 01/25/2020   Lab Results  Component Value Date   TRIG 137.0 01/25/2020   Lab Results  Component Value Date   CHOLHDL 3 01/25/2020   No results found for: HGBA1C     Assessment & Plan:   Problem List Items Addressed This Visit   None   Visit Diagnoses    Paronychia of right thumb    -  Primary   Relevant Medications   sulfamethoxazole-trimethoprim (BACTRIM DS) 800-160 MG tablet   Trichomoniasis of vagina       Relevant Medications   sulfamethoxazole-trimethoprim (BACTRIM DS) 800-160 MG tablet       Meds ordered this encounter  Medications  . ibuprofen (ADVIL) 800 MG tablet    Sig: Take 1 tablet (800 mg total) by mouth every 8 (eight) hours as needed.    Dispense:  30 tablet    Refill:  0  . sulfamethoxazole-trimethoprim (BACTRIM DS) 800-160 MG tablet    Sig: Take 1 tablet by mouth 2 (two) times daily.    Dispense:  14 tablet    Refill:  0  . traMADol (ULTRAM) 50 MG tablet    Sig: Take 1 tablet  (50 mg total) by mouth every 8 (eight) hours as needed.    Dispense:  15 tablet    Refill:  0    Call the office with any questions or concerns.  Kennyth Arnold, FNP

## 2020-09-04 ENCOUNTER — Other Ambulatory Visit: Payer: BC Managed Care – PPO

## 2020-09-04 LAB — URINE CYTOLOGY ANCILLARY ONLY
Chlamydia: NEGATIVE
Comment: NEGATIVE
Comment: NEGATIVE
Comment: NORMAL
Neisseria Gonorrhea: NEGATIVE
Trichomonas: NEGATIVE

## 2020-09-19 DIAGNOSIS — F332 Major depressive disorder, recurrent severe without psychotic features: Secondary | ICD-10-CM | POA: Diagnosis not present

## 2020-09-19 DIAGNOSIS — Z79899 Other long term (current) drug therapy: Secondary | ICD-10-CM | POA: Diagnosis not present

## 2020-09-19 DIAGNOSIS — F1220 Cannabis dependence, uncomplicated: Secondary | ICD-10-CM | POA: Diagnosis not present

## 2020-09-19 DIAGNOSIS — F411 Generalized anxiety disorder: Secondary | ICD-10-CM | POA: Diagnosis not present

## 2020-10-08 DIAGNOSIS — F411 Generalized anxiety disorder: Secondary | ICD-10-CM | POA: Diagnosis not present

## 2020-10-08 DIAGNOSIS — F1220 Cannabis dependence, uncomplicated: Secondary | ICD-10-CM | POA: Diagnosis not present

## 2020-10-08 DIAGNOSIS — F332 Major depressive disorder, recurrent severe without psychotic features: Secondary | ICD-10-CM | POA: Diagnosis not present

## 2020-10-08 DIAGNOSIS — Z79899 Other long term (current) drug therapy: Secondary | ICD-10-CM | POA: Diagnosis not present

## 2020-10-16 DIAGNOSIS — F411 Generalized anxiety disorder: Secondary | ICD-10-CM | POA: Diagnosis not present

## 2020-10-16 DIAGNOSIS — Z79899 Other long term (current) drug therapy: Secondary | ICD-10-CM | POA: Diagnosis not present

## 2020-10-16 DIAGNOSIS — F332 Major depressive disorder, recurrent severe without psychotic features: Secondary | ICD-10-CM | POA: Diagnosis not present

## 2020-10-16 DIAGNOSIS — F1220 Cannabis dependence, uncomplicated: Secondary | ICD-10-CM | POA: Diagnosis not present

## 2020-11-01 DIAGNOSIS — F411 Generalized anxiety disorder: Secondary | ICD-10-CM | POA: Diagnosis not present

## 2020-11-01 DIAGNOSIS — Z79899 Other long term (current) drug therapy: Secondary | ICD-10-CM | POA: Diagnosis not present

## 2020-11-01 DIAGNOSIS — F1220 Cannabis dependence, uncomplicated: Secondary | ICD-10-CM | POA: Diagnosis not present

## 2020-11-01 DIAGNOSIS — F332 Major depressive disorder, recurrent severe without psychotic features: Secondary | ICD-10-CM | POA: Diagnosis not present

## 2020-11-08 DIAGNOSIS — F1220 Cannabis dependence, uncomplicated: Secondary | ICD-10-CM | POA: Diagnosis not present

## 2020-11-08 DIAGNOSIS — F411 Generalized anxiety disorder: Secondary | ICD-10-CM | POA: Diagnosis not present

## 2020-11-08 DIAGNOSIS — Z79899 Other long term (current) drug therapy: Secondary | ICD-10-CM | POA: Diagnosis not present

## 2020-11-08 DIAGNOSIS — F332 Major depressive disorder, recurrent severe without psychotic features: Secondary | ICD-10-CM | POA: Diagnosis not present

## 2020-11-15 DIAGNOSIS — F411 Generalized anxiety disorder: Secondary | ICD-10-CM | POA: Diagnosis not present

## 2020-11-15 DIAGNOSIS — Z79899 Other long term (current) drug therapy: Secondary | ICD-10-CM | POA: Diagnosis not present

## 2020-11-15 DIAGNOSIS — F1220 Cannabis dependence, uncomplicated: Secondary | ICD-10-CM | POA: Diagnosis not present

## 2020-11-15 DIAGNOSIS — F332 Major depressive disorder, recurrent severe without psychotic features: Secondary | ICD-10-CM | POA: Diagnosis not present

## 2020-11-20 DIAGNOSIS — F1220 Cannabis dependence, uncomplicated: Secondary | ICD-10-CM | POA: Diagnosis not present

## 2020-11-20 DIAGNOSIS — F332 Major depressive disorder, recurrent severe without psychotic features: Secondary | ICD-10-CM | POA: Diagnosis not present

## 2020-11-20 DIAGNOSIS — F411 Generalized anxiety disorder: Secondary | ICD-10-CM | POA: Diagnosis not present

## 2020-11-20 DIAGNOSIS — F1010 Alcohol abuse, uncomplicated: Secondary | ICD-10-CM | POA: Diagnosis not present

## 2020-12-03 ENCOUNTER — Other Ambulatory Visit: Payer: Self-pay

## 2020-12-04 ENCOUNTER — Ambulatory Visit: Payer: BC Managed Care – PPO | Admitting: Family Medicine

## 2020-12-04 ENCOUNTER — Encounter: Payer: Self-pay | Admitting: Family Medicine

## 2020-12-04 ENCOUNTER — Telehealth: Payer: Self-pay | Admitting: Family Medicine

## 2020-12-04 ENCOUNTER — Other Ambulatory Visit (HOSPITAL_COMMUNITY)
Admission: RE | Admit: 2020-12-04 | Discharge: 2020-12-04 | Disposition: A | Discharge status: Home / Self Care | Payer: BC Managed Care – PPO | Source: Ambulatory Visit | Attending: Family Medicine | Admitting: Family Medicine

## 2020-12-04 VITALS — BP 112/68 | HR 85 | Temp 97.8°F | Ht 65.0 in | Wt 167.2 lb

## 2020-12-04 DIAGNOSIS — D649 Anemia, unspecified: Secondary | ICD-10-CM

## 2020-12-04 DIAGNOSIS — Z113 Encounter for screening for infections with a predominantly sexual mode of transmission: Secondary | ICD-10-CM

## 2020-12-04 DIAGNOSIS — N760 Acute vaginitis: Secondary | ICD-10-CM

## 2020-12-04 DIAGNOSIS — B9689 Other specified bacterial agents as the cause of diseases classified elsewhere: Secondary | ICD-10-CM | POA: Insufficient documentation

## 2020-12-04 LAB — URINALYSIS, ROUTINE W REFLEX MICROSCOPIC
Bilirubin Urine: NEGATIVE
Nitrite: NEGATIVE
Specific Gravity, Urine: >=1.03 — AB (ref 1.000–1.030)
Urine Glucose: NEGATIVE
Urobilinogen, UA: 0.2 (ref 0.0–1.0)
pH: 6 (ref 5.0–8.0)

## 2020-12-04 LAB — CBC
HCT: 38.3 % (ref 36.0–46.0)
Hemoglobin: 12.6 g/dL (ref 12.0–15.0)
MCHC: 33 g/dL (ref 30.0–36.0)
MCV: 86.9 fl (ref 78.0–100.0)
Platelets: 267 10*3/uL (ref 150.0–400.0)
RBC: 4.41 Mil/uL (ref 3.87–5.11)
RDW: 15.4 % (ref 11.5–15.5)
WBC: 8.8 10*3/uL (ref 4.0–10.5)

## 2020-12-04 LAB — B12 AND FOLATE PANEL
Folate: 7.8 ng/mL (ref >5.9)
Vitamin B-12: 180 pg/mL — ABNORMAL LOW (ref 211–911)

## 2020-12-04 NOTE — Progress Notes (Signed)
Established Patient Office Visit  Subjective:  Patient ID: Sandra Sparks, female    DOB: August 30, 1981  Age: 40 y.o. MRN: [id number]  CC:  Chief Complaint  Patient presents with  . Exposure to STD    Patient would like to be checked fr STD's. States that she has abdominal pressure and back pains.     HPI [name] presents for evaluation of a 3 to 4-day history of severe suprapubic pressure with lower back pain.  There is scant vaginal discharge.  Denies dysuria.  Last sexual encounter was 4 days ago.  Status post BTL.  Last menses was in February.  Past Medical History:  Diagnosis Date  . Depression   . Ovarian cyst     Past Surgical History:  Procedure Laterality Date  . BACK SURGERY      Family History  Problem Relation Age of Onset  . Diabetes Maternal Grandmother     Social History   Socioeconomic History  . Marital status: Single    Spouse name: Not on file  . Number of children: Not on file  . Years of education: Not on file  . Highest education level: Not on file  Occupational History  . Not on file  Tobacco Use  . Smoking status: Current Every Day Smoker    Packs/day: 0.50    Types: Cigarettes  . Smokeless tobacco: Never Used  Vaping Use  . Vaping Use: Never used  Substance and Sexual Activity  . Alcohol use: Yes    Comment: 3 x week  . Drug use: Not Currently  . Sexual activity: Not Currently    Birth control/protection: Surgical  Other Topics Concern  . Not on file  Social History Narrative  . Not on file   Social Determinants of Health   Financial Resource Strain: Not on file  Food Insecurity: Not on file  Transportation Needs: Not on file  Physical Activity: Not on file  Stress: Not on file  Social Connections: Not on file  Intimate Partner Violence: Not on file    Outpatient Medications Prior to Visit  Medication Sig Dispense Refill  . traMADol (ULTRAM) 50 MG tablet Take 1 tablet (50 mg total) by mouth every 8 (eight) hours as  needed. 15 tablet 0  . venlafaxine XR (EFFEXOR-XR) 75 MG 24 hr capsule Take 75 mg by mouth daily with breakfast.    . ibuprofen (ADVIL) 800 MG tablet Take 1 tablet (800 mg total) by mouth every 8 (eight) hours as needed. (Patient not taking: Reported on 12/04/2020) 30 tablet 0  . indomethacin (INDOCIN) 50 MG capsule Take 1 capsule (50 mg total) by mouth 3 (three) times daily with meals. (Patient not taking: Reported on 12/04/2020) 30 capsule 1  . sulfamethoxazole-trimethoprim (BACTRIM DS) 800-160 MG tablet Take 1 tablet by mouth 2 (two) times daily. (Patient not taking: Reported on 12/04/2020) 14 tablet 0   No facility-administered medications prior to visit.    Allergies  Allergen Reactions  . Penicillins Other (See Comments)    Unsure     ROS Review of Systems  HENT: Negative.   Respiratory: Negative.   Cardiovascular: Negative.   Gastrointestinal: Negative for anal bleeding, blood in stool and constipation.  Endocrine: Negative for polyphagia and polyuria.  Genitourinary: Negative for decreased urine volume, difficulty urinating, dysuria, frequency, genital sores, hematuria, vaginal bleeding and vaginal pain.  Musculoskeletal: Positive for back pain. Negative for myalgias.  Neurological: Negative for weakness and numbness.  Psychiatric/Behavioral: Negative.  Objective:    Physical Exam Vitals and nursing note reviewed. Exam conducted with a chaperone present.  Constitutional:      General: She is not in acute distress.    Appearance: Normal appearance. She is not ill-appearing or toxic-appearing.  HENT:     Head: Normocephalic and atraumatic.     Right Ear: External ear normal.     Left Ear: External ear normal.  Eyes:     General: No scleral icterus.    Extraocular Movements: Extraocular movements intact.     Conjunctiva/sclera: Conjunctivae normal.  Pulmonary:     Effort: Pulmonary effort is normal.  Abdominal:     General: Abdomen is flat. Bowel sounds are  normal. There is no distension.     Palpations: Abdomen is soft. There is no mass.     Tenderness: There is no abdominal tenderness. There is no guarding or rebound.     Hernia: No hernia is present.  Genitourinary:    Labia:        Right: No rash, tenderness, lesion or injury.        Left: No rash, tenderness, lesion or injury.      Urethra: No prolapse, urethral pain, urethral swelling or urethral lesion.     Comments: Vaginal mucus appears to be normal.  Normal vaginal mucosa.  No CMT.  Uterus felt to be orange sized.  Adnexa not enlarged. Musculoskeletal:     Lumbar back: No tenderness. Normal range of motion. Negative right straight leg raise test and negative left straight leg raise test.  Lymphadenopathy:     Lower Body: No right inguinal adenopathy. No left inguinal adenopathy.  Skin:    General: Skin is warm and dry.  Neurological:     Mental Status: She is alert and oriented to person, place, and time.     Motor: Motor function is intact.  Psychiatric:        Mood and Affect: Mood normal.        Behavior: Behavior normal.     BP 112/68   Pulse 85   Temp 97.8 F (36.6 C) (Temporal)   Ht 5\' 5"  (1.651 m)   Wt 167 lb 3.2 oz (75.8 kg)   SpO2 98%   BMI 27.82 kg/m  Wt Readings from Last 3 Encounters:  12/04/20 167 lb 3.2 oz (75.8 kg)  09/03/20 177 lb 6.4 oz (80.5 kg)  08/24/20 176 lb (79.8 kg)     Health Maintenance Due  Topic Date Due  . Hepatitis C Screening  Never done  . COVID-19 Vaccine (3 - Booster for Moderna series) 05/06/2020    There are no preventive care reminders to display for this patient.  Lab Results  Component Value Date   TSH 0.47 01/25/2020   Lab Results  Component Value Date   WBC 8.8 12/04/2020   HGB 12.6 12/04/2020   HCT 38.3 12/04/2020   MCV 86.9 12/04/2020   PLT 267.0 12/04/2020   Lab Results  Component Value Date   NA 138 01/25/2020   K 4.3 01/25/2020   CO2 28 01/25/2020   GLUCOSE 108 (H) 01/25/2020   BUN 8 01/25/2020    CREATININE 0.90 01/25/2020   BILITOT 0.2 01/25/2020   ALKPHOS 61 01/25/2020   AST 2 01/25/2020   ALT 9 01/25/2020   PROT 6.6 01/25/2020   ALBUMIN 4.1 01/25/2020   CALCIUM 8.8 01/25/2020   ANIONGAP 8 12/09/2017   GFR 83.35 01/25/2020   Lab Results  Component Value Date  CHOL 212 (H) 01/25/2020   Lab Results  Component Value Date   HDL 65.80 01/25/2020   Lab Results  Component Value Date   LDLCALC 118 (H) 01/25/2020   Lab Results  Component Value Date   TRIG 137.0 01/25/2020   Lab Results  Component Value Date   CHOLHDL 3 01/25/2020   No results found for: HGBA1C    Assessment & Plan:   Problem List Items Addressed This Visit      Other   Screen for STD (sexually transmitted disease) - Primary   Relevant Orders   Urinalysis, Routine w reflex microscopic (Completed)   HIV Antibody (routine testing w rflx) (Completed)   Wet prep, genital   Cervicovaginal ancillary only( [place]) (Completed)   Anemia   Relevant Medications   ferrous sulfate 220 (44 Fe) MG/5ML solution   Other Relevant Orders   CBC (Completed)   Iron, TIBC and Ferritin Panel (Completed)   Urinalysis, Routine w reflex microscopic (Completed)   B12 and Folate Panel (Completed)    Other Visit Diagnoses    Gardnerella associated vaginal discharge       Relevant Medications   metroNIDAZOLE (FLAGYL) 500 MG tablet      Meds ordered this encounter  Medications  . ferrous sulfate 220 (44 Fe) MG/5ML solution    Sig: Take 5 mLs (220 mg total) by mouth 2 (two) times daily with a meal.    Dispense:  150 mL    Refill:  3  . metroNIDAZOLE (FLAGYL) 500 MG tablet    Sig: Take 1 tablet (500 mg total) by mouth 2 (two) times daily for 7 days.    Dispense:  14 tablet    Refill:  0    Follow-up: Return in about 1 month (around 01/04/2021), or if symptoms worsen or fail to improve.    Libby Maw, MD

## 2020-12-04 NOTE — Telephone Encounter (Signed)
Spoke with Stanton Kidney who verbally understood only test that was needed was STD testing.

## 2020-12-04 NOTE — Telephone Encounter (Signed)
Mary from the Cytology dept. At [facility] is needing an order placed for a pap for the pt. Please advise Stanton Kidney at [phone].

## 2020-12-05 LAB — IRON,TIBC AND FERRITIN PANEL
%SAT: 6 % (calc) — ABNORMAL LOW (ref 16–45)
Ferritin: 6 ng/mL — ABNORMAL LOW (ref 16–232)
Iron: 28 ug/dL — ABNORMAL LOW (ref 40–190)
TIBC: 470 mcg/dL (calc) — ABNORMAL HIGH (ref 250–450)

## 2020-12-05 LAB — CERVICOVAGINAL ANCILLARY ONLY
Bacterial Vaginitis (gardnerella): POSITIVE — AB
Chlamydia: NEGATIVE
Comment: NEGATIVE
Comment: NEGATIVE
Comment: NEGATIVE
Comment: NORMAL
Neisseria Gonorrhea: NEGATIVE
Trichomonas: NEGATIVE

## 2020-12-05 LAB — HIV ANTIBODY (ROUTINE TESTING W REFLEX): HIV 1&2 Ab, 4th Generation: NONREACTIVE

## 2020-12-05 MED ORDER — FERROUS SULFATE 220 (44 FE) MG/5ML PO ELIX
220.0000 mg | ORAL_SOLUTION | Freq: Two times a day (BID) | ORAL | 3 refills | Status: DC
Start: 2020-12-05 — End: 2021-11-19

## 2020-12-05 MED ORDER — METRONIDAZOLE 500 MG PO TABS: 500.0000 mg | ORAL_TABLET | Freq: Two times a day (BID) | ORAL | 0 refills | Status: AC

## 2020-12-05 NOTE — Addendum Note (Signed)
Addended by: Jon Billings on: 12/05/2020 12:50 PM   Modules accepted: Orders

## 2020-12-20 DIAGNOSIS — F1220 Cannabis dependence, uncomplicated: Secondary | ICD-10-CM | POA: Diagnosis not present

## 2020-12-20 DIAGNOSIS — F419 Anxiety disorder, unspecified: Secondary | ICD-10-CM | POA: Diagnosis not present

## 2020-12-20 DIAGNOSIS — F332 Major depressive disorder, recurrent severe without psychotic features: Secondary | ICD-10-CM | POA: Diagnosis not present

## 2020-12-20 DIAGNOSIS — F1010 Alcohol abuse, uncomplicated: Secondary | ICD-10-CM | POA: Diagnosis not present

## 2021-01-02 ENCOUNTER — Other Ambulatory Visit: Payer: Self-pay

## 2021-01-03 ENCOUNTER — Other Ambulatory Visit (HOSPITAL_COMMUNITY)
Admission: RE | Admit: 2021-01-03 | Discharge: 2021-01-03 | Disposition: A | Discharge status: Home / Self Care | Payer: BC Managed Care – PPO | Source: Ambulatory Visit | Attending: Family Medicine | Admitting: Family Medicine

## 2021-01-03 ENCOUNTER — Encounter: Payer: Self-pay | Admitting: Family Medicine

## 2021-01-03 ENCOUNTER — Ambulatory Visit: Payer: BC Managed Care – PPO | Admitting: Family Medicine

## 2021-01-03 VITALS — BP 92/68 | HR 82 | Temp 98.0°F | Ht 65.0 in | Wt 165.8 lb

## 2021-01-03 DIAGNOSIS — Z113 Encounter for screening for infections with a predominantly sexual mode of transmission: Secondary | ICD-10-CM | POA: Insufficient documentation

## 2021-01-03 DIAGNOSIS — R3915 Urgency of urination: Secondary | ICD-10-CM | POA: Diagnosis not present

## 2021-01-03 DIAGNOSIS — R350 Frequency of micturition: Secondary | ICD-10-CM | POA: Insufficient documentation

## 2021-01-03 LAB — POCT URINALYSIS DIPSTICK
Bilirubin, UA: NEGATIVE
Blood, UA: 10
Glucose, UA: NEGATIVE
Ketones, UA: NEGATIVE
Nitrite, UA: NEGATIVE
Protein, UA: NEGATIVE
Spec Grav, UA: 1.01 (ref 1.010–1.025)
Urobilinogen, UA: 0.2 E.U./dL
pH, UA: 6 (ref 5.0–8.0)

## 2021-01-03 MED ORDER — NITROFURANTOIN MONOHYD MACRO 100 MG PO CAPS: 100.0000 mg | ORAL_CAPSULE | Freq: Two times a day (BID) | ORAL | 0 refills | Status: AC

## 2021-01-03 NOTE — Progress Notes (Signed)
Sandra Sparks is a 40 y.o. female  Chief Complaint  Patient presents with  . Exposure to STD    Pt c/o frequent urination, constant pressure.  Pt has been treated for BV in April.  Pt has given a urine sample     HPI: Sandra Sparks is a 40 y.o. female patient of my colleague Dr. Ethelene Hal who is seen today for urinary frequency, urgency, and lower abdominal pressure that is present and unrelated to urination.  No dysuria. No gross hematuria. No vaginal itching, odor, discharge. No abnormal bleeding She denies any known exposure or symptoms.  Pt states she was treated for BV by PCP 1 mo ago. Discharge and odor resolved. Still having urinary frequency.  Past Medical History:  Diagnosis Date  . Depression   . Ovarian cyst     Past Surgical History:  Procedure Laterality Date  . BACK SURGERY      Social History   Socioeconomic History  . Marital status: Single    Spouse name: Not on file  . Number of children: Not on file  . Years of education: Not on file  . Highest education level: Not on file  Occupational History  . Not on file  Tobacco Use  . Smoking status: Current Every Day Smoker    Packs/day: 0.50    Types: Cigarettes  . Smokeless tobacco: Never Used  Vaping Use  . Vaping Use: Never used  Substance and Sexual Activity  . Alcohol use: Yes    Comment: 3 x week  . Drug use: Not Currently  . Sexual activity: Not Currently    Birth control/protection: Surgical  Other Topics Concern  . Not on file  Social History Narrative  . Not on file   Social Determinants of Health   Financial Resource Strain: Not on file  Food Insecurity: Not on file  Transportation Needs: Not on file  Physical Activity: Not on file  Stress: Not on file  Social Connections: Not on file  Intimate Partner Violence: Not on file    Family History  Problem Relation Age of Onset  . Diabetes Maternal Grandmother      Immunization History  Administered Date(s) Administered  .  Influenza Split 08/06/2020  . Moderna Sars-Covid-2 Vaccination 10/10/2019, 11/07/2019    Outpatient Encounter Medications as of 01/03/2021  Medication Sig  . ferrous sulfate 220 (44 Fe) MG/5ML solution Take 5 mLs (220 mg total) by mouth 2 (two) times daily with a meal.  . nitrofurantoin, macrocrystal-monohydrate, (MACROBID) 100 MG capsule Take 1 capsule (100 mg total) by mouth 2 (two) times daily for 7 days.  Marland Kitchen QUEtiapine (SEROQUEL) 25 MG tablet Take 25 mg by mouth at bedtime.  Marland Kitchen venlafaxine XR (EFFEXOR-XR) 75 MG 24 hr capsule Take 75 mg by mouth daily with breakfast.  . traMADol (ULTRAM) 50 MG tablet Take 1 tablet (50 mg total) by mouth every 8 (eight) hours as needed. (Patient not taking: Reported on 01/03/2021)  . [DISCONTINUED] ibuprofen (ADVIL) 800 MG tablet Take 1 tablet (800 mg total) by mouth every 8 (eight) hours as needed. (Patient not taking: Reported on 12/04/2020)  . [DISCONTINUED] indomethacin (INDOCIN) 50 MG capsule Take 1 capsule (50 mg total) by mouth 3 (three) times daily with meals. (Patient not taking: Reported on 12/04/2020)  . [DISCONTINUED] sulfamethoxazole-trimethoprim (BACTRIM DS) 800-160 MG tablet Take 1 tablet by mouth 2 (two) times daily. (Patient not taking: Reported on 12/04/2020)   No facility-administered encounter medications on file as of 01/03/2021.  ROS: Pertinent positives and negatives noted in HPI. Remainder of ROS non-contributory  Allergies  Allergen Reactions  . Penicillins Other (See Comments)    Unsure     BP 92/68 (BP Location: Left Arm, Patient Position: Sitting, Cuff Size: Normal)   Pulse 82   Temp 98 F (36.7 C) (Oral)   Ht 5\' 5"  (1.651 m)   Wt 165 lb 12.8 oz (75.2 kg)   LMP 12/21/2020   SpO2 98%   BMI 27.59 kg/m   Wt Readings from Last 3 Encounters:  01/03/21 165 lb 12.8 oz (75.2 kg)  12/04/20 167 lb 3.2 oz (75.8 kg)  09/03/20 177 lb 6.4 oz (80.5 kg)   Temp Readings from Last 3 Encounters:  01/03/21 98 F (36.7 C) (Oral)   12/04/20 97.8 F (36.6 C) (Temporal)  09/03/20 (!) 96.8 F (36 C) (Temporal)   BP Readings from Last 3 Encounters:  01/03/21 92/68  12/04/20 112/68  09/03/20 116/74   Pulse Readings from Last 3 Encounters:  01/03/21 82  12/04/20 85  09/03/20 87     Physical Exam Constitutional:      General: She is not in acute distress.    Appearance: She is not ill-appearing.  Abdominal:     General: Abdomen is flat. Bowel sounds are normal. There is no distension.     Palpations: Abdomen is soft.     Tenderness: There is no abdominal tenderness. There is no right CVA tenderness or left CVA tenderness.  Neurological:     Mental Status: She is alert and oriented to person, place, and time.      A/P:  1. Urinary frequency 2. Urinary urgency - POCT Urinalysis Dipstick - positive for RBCs and WBCs - Urine Culture Rx: - nitrofurantoin, macrocrystal-monohydrate, (MACROBID) 100 MG capsule; Take 1 capsule (100 mg total) by mouth 2 (two) times daily for 7 days.  Dispense: 14 capsule; Refill: 0  3. Screening examination for STD (sexually transmitted disease) - HIV Antibody (routine testing w rflx) - Hepatitis B surface antigen - RPR - Hepatitis C Antibody - Urine cytology ancillary only([place])   This visit occurred during the SARS-CoV-2 public health emergency.  Safety protocols were in place, including screening questions prior to the visit, additional usage of staff PPE, and extensive cleaning of exam room while observing appropriate contact time as indicated for disinfecting solutions.

## 2021-01-04 LAB — HEPATITIS B SURFACE ANTIGEN: Hepatitis B Surface Ag: NONREACTIVE

## 2021-01-04 LAB — URINE CULTURE
MICRO NUMBER:: 11797921
SPECIMEN QUALITY:: ADEQUATE

## 2021-01-04 LAB — URINE CYTOLOGY ANCILLARY ONLY
Bacterial Vaginitis-Urine: NEGATIVE
Candida Urine: NEGATIVE
Chlamydia: NEGATIVE
Comment: NEGATIVE
Comment: NEGATIVE
Comment: NORMAL
Neisseria Gonorrhea: NEGATIVE
Trichomonas: NEGATIVE

## 2021-01-04 LAB — HEPATITIS C ANTIBODY
Hepatitis C Ab: NONREACTIVE
SIGNAL TO CUT-OFF: 0.4 (ref <1.00)

## 2021-01-04 LAB — HIV ANTIBODY (ROUTINE TESTING W REFLEX): HIV 1&2 Ab, 4th Generation: NONREACTIVE

## 2021-01-04 LAB — RPR: RPR Ser Ql: NONREACTIVE

## 2021-01-10 ENCOUNTER — Telehealth: Payer: Self-pay | Admitting: Family Medicine

## 2021-01-10 NOTE — Telephone Encounter (Signed)
Pt would like a cb concerning her most recent lab results. Please advise at [phone].

## 2021-01-11 DIAGNOSIS — F1010 Alcohol abuse, uncomplicated: Secondary | ICD-10-CM | POA: Diagnosis not present

## 2021-01-11 DIAGNOSIS — F332 Major depressive disorder, recurrent severe without psychotic features: Secondary | ICD-10-CM | POA: Diagnosis not present

## 2021-01-11 DIAGNOSIS — F1220 Cannabis dependence, uncomplicated: Secondary | ICD-10-CM | POA: Diagnosis not present

## 2021-01-11 NOTE — Telephone Encounter (Signed)
Patient aware that labs were negative.

## 2021-01-11 NOTE — Telephone Encounter (Signed)
Pt said she missed a call yesterday and just requested call back again at same number

## 2021-01-22 DIAGNOSIS — F411 Generalized anxiety disorder: Secondary | ICD-10-CM | POA: Diagnosis not present

## 2021-02-01 ENCOUNTER — Ambulatory Visit: Payer: BC Managed Care – PPO | Admitting: Family Medicine

## 2021-02-04 ENCOUNTER — Encounter: Payer: Self-pay | Admitting: Family Medicine

## 2021-02-04 ENCOUNTER — Ambulatory Visit: Payer: BC Managed Care – PPO | Admitting: Family Medicine

## 2021-02-04 ENCOUNTER — Other Ambulatory Visit: Payer: Self-pay

## 2021-02-04 VITALS — BP 118/78 | HR 81 | Temp 97.3°F | Ht 65.0 in | Wt 165.4 lb

## 2021-02-04 DIAGNOSIS — N760 Acute vaginitis: Secondary | ICD-10-CM | POA: Diagnosis not present

## 2021-02-04 DIAGNOSIS — B9689 Other specified bacterial agents as the cause of diseases classified elsewhere: Secondary | ICD-10-CM

## 2021-02-04 MED ORDER — METRONIDAZOLE 500 MG PO TABS: 500.0000 mg | ORAL_TABLET | Freq: Two times a day (BID) | ORAL | 0 refills | Status: AC

## 2021-02-04 NOTE — Progress Notes (Signed)
  [facility] [address] [place] [address] Dept: [phone] Dept Fax: [phone]  Office Visit  Subjective:    Patient ID: Sandra Sparks, female    DOB: 11-03-1980, 40 y.o..   MRN: [id number]  Chief Complaint  Patient presents with  . Acute Visit    C/o having vaginal discharge, itching and oder x 1 week.   She has used OTC monistat with no relief.     History of Present Illness:  Patient is in today for complaining of a 1-week history of vaginal discharge. She describes the discharge as yellowish and associated with itching and a bad odor. She tried using a single course of Monistat along with Vagisil. She felt the discharge was worse afterwards. She has some concerns for STDs, noting her current partner may have been unfaithful. They do not use condoms.  Past Medical History: Patient Active Problem List   Diagnosis Date Noted  . Anemia 12/04/2020  . Lipoma of right lower extremity 01/06/2020  . Acne vulgaris 01/06/2020  . Dysmenorrhea 01/06/2020  . Post traumatic stress disorder 01/06/2020  . Screen for STD (sexually transmitted disease) 01/06/2020  . Dermoid cyst 06/12/2016  . Pelvic pain in female 06/12/2016   Past Surgical History:  Procedure Laterality Date  . BACK SURGERY     Family History  Problem Relation Age of Onset  . Diabetes Maternal Grandmother    Outpatient Medications Prior to Visit  Medication Sig Dispense Refill  . ferrous sulfate 220 (44 Fe) MG/5ML solution Take 5 mLs (220 mg total) by mouth 2 (two) times daily with a meal. 150 mL 3  . venlafaxine XR (EFFEXOR-XR) 75 MG 24 hr capsule Take 75 mg by mouth daily.    . QUEtiapine (SEROQUEL) 25 MG tablet Take 25 mg by mouth at bedtime. (Patient not taking: Reported on 02/04/2021)     No facility-administered medications prior to visit.   Allergies  Allergen Reactions  . Penicillins Other (See Comments)    Unsure    Objective:   Today's  Vitals   02/04/21 0942  BP: 118/78  Pulse: 81  Temp: (!) 97.3 F (36.3 C)  TempSrc: Temporal  SpO2: 99%  Weight: 165 lb 6.4 oz (75 kg)  Height: 5\' 5"  (1.651 m)   Body mass index is 27.52 kg/m.   General: Well developed, well nourished. No acute distress. GU: Normal external genitalia with piercings present. Vaginal mucosa and cervix are pink. There is a moderate vaginal   discharge, slightly brown and frothy in appearance. Mild to moderate CMT. Uterus normal size. No adnexal masses. Psych: Alert and oriented. Normal mood and affect.  Health Maintenance Due  Topic Date Due  . TETANUS/TDAP  Never done  . COVID-19 Vaccine (3 - Booster for Moderna series) 04/05/2020   Assessment & Plan:   1. Bacterial vaginosis Discharge appears to be due to BV. We will send a swab for KOH/Wet prep to confirm, but I will treat her empirically. I also collected specimens for GC/chlamydia. We will complete additional STD blood tests.  - Chlamydia/GC NAA, Confirmation - WET PREP FOR TRICH, YEAST, CLUE - HCV Ab w Reflex to Quant PCR - Hepatitis B surface antigen - HIV Antibody (routine testing w rflx) - RPR - metroNIDAZOLE (FLAGYL) 500 MG tablet; Take 1 tablet (500 mg total) by mouth 2 (two) times daily for 7 days.  Dispense: 14 tablet; Refill: 0  Haydee Salter, MD

## 2021-02-05 DIAGNOSIS — F411 Generalized anxiety disorder: Secondary | ICD-10-CM | POA: Diagnosis not present

## 2021-02-05 DIAGNOSIS — N760 Acute vaginitis: Secondary | ICD-10-CM | POA: Diagnosis not present

## 2021-02-05 DIAGNOSIS — B9689 Other specified bacterial agents as the cause of diseases classified elsewhere: Secondary | ICD-10-CM | POA: Diagnosis not present

## 2021-02-05 LAB — WET PREP FOR TRICH, YEAST, CLUE

## 2021-02-05 LAB — RPR: RPR Ser Ql: NONREACTIVE

## 2021-02-05 LAB — HIV ANTIBODY (ROUTINE TESTING W REFLEX): HIV 1&2 Ab, 4th Generation: NONREACTIVE

## 2021-02-05 LAB — HEPATITIS B SURFACE ANTIGEN: Hepatitis B Surface Ag: NONREACTIVE

## 2021-02-05 NOTE — Addendum Note (Signed)
Addended by: Beryle Lathe S on: 02/05/2021 11:29 AM   Modules accepted: Orders

## 2021-02-07 LAB — CHLAMYDIA/GC NAA, CONFIRMATION
Chlamydia trachomatis, NAA: NEGATIVE
Chlamydia trachomatis, NAA: NEGATIVE
Neisseria gonorrhoeae, NAA: NEGATIVE
Neisseria gonorrhoeae, NAA: NEGATIVE

## 2021-02-12 DIAGNOSIS — F330 Major depressive disorder, recurrent, mild: Secondary | ICD-10-CM | POA: Diagnosis not present

## 2021-02-13 DIAGNOSIS — H524 Presbyopia: Secondary | ICD-10-CM | POA: Diagnosis not present

## 2021-02-20 DIAGNOSIS — F332 Major depressive disorder, recurrent severe without psychotic features: Secondary | ICD-10-CM | POA: Diagnosis not present

## 2021-03-11 ENCOUNTER — Telehealth (INDEPENDENT_AMBULATORY_CARE_PROVIDER_SITE_OTHER): Payer: BC Managed Care – PPO | Admitting: Family Medicine

## 2021-03-11 ENCOUNTER — Encounter: Payer: Self-pay | Admitting: Family Medicine

## 2021-03-11 VITALS — Temp 97.0°F | Ht 65.0 in | Wt 165.0 lb

## 2021-03-11 DIAGNOSIS — J069 Acute upper respiratory infection, unspecified: Secondary | ICD-10-CM | POA: Diagnosis not present

## 2021-03-11 NOTE — Progress Notes (Signed)
[facility] [address] [place] [address] Dept: [phone] Dept Fax: [phone]  Virtual Video Visit  I connected with Sandra Sparks on 03/11/21 at 11:30 AM EDT by a video enabled telemedicine application and verified that I am speaking with the correct person using two identifiers.  Location patient: Home Location provider: Clinic Persons participating in the virtual visit: Patient, Provider  I discussed the limitations of evaluation and management by telemedicine and the availability of in person appointments. The patient expressed understanding and agreed to proceed.  Chief Complaint  Patient presents with   Acute Visit    C/o fatigue, weakness, ST, chills, muscle/chest pains, SOB, no appetite x 3 days.  Took 2 covid tests both negative.  Will need note to rtn to work.      SUBJECTIVE:  HPI: Sandra Sparks is a 40 y.o. female who presents with  3-4 day history of fatigue, weakness, sopre throat, chills, myalgias, and dyspnea. She has run 2 home COVID test, which were negative. She has been using Nyquil with little relief. 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 She states she would need a release to return to work. She admits she is not feeling improved as of yet.  Patient Active Problem List   Diagnosis Date Noted   Anemia 12/04/2020   Lipoma of right lower extremity 01/06/2020   Acne vulgaris 01/06/2020   Dysmenorrhea 01/06/2020   Post traumatic stress disorder 01/06/2020   Screen for STD (sexually transmitted disease) 01/06/2020   Dermoid cyst 06/12/2016   Pelvic pain in female 06/12/2016   Past Surgical History:  Procedure Laterality Date   BACK SURGERY     Family History  Problem Relation Age of Onset   Diabetes Maternal Grandmother    Social History   Tobacco Use   Smoking status: Every Day    Packs/day: 0.50    Pack years: 0.00    Types: Cigarettes   Smokeless tobacco: Never  Vaping Use    Vaping Use: Never used  Substance Use Topics   Alcohol use: Yes    Comment: 3 x week   Drug use: Not Currently    Current Outpatient Medications:    ferrous sulfate 220 (44 Fe) MG/5ML solution, Take 5 mLs (220 mg total) by mouth 2 (two) times daily with a meal., Disp: 150 mL, Rfl: 3   venlafaxine XR (EFFEXOR-XR) 75 MG 24 hr capsule, Take 75 mg by mouth daily., Disp: , Rfl:   Allergies  Allergen Reactions   Penicillins Other (See Comments)    Unsure    ROS: See pertinent positives and negatives per HPI.  OBSERVATIONS/OBJECTIVE:  VITALS per patient if applicable: Today's Vitals   03/11/21 1118  Temp: (!) 97 F (36.1 C)  TempSrc: Temporal  Weight: 165 lb (74.8 kg)  Height: 5\' 5"  (1.651 m)   Body mass index is 27.46 kg/m.    GENERAL: Alert and oriented. Appears well and in no acute distress.  HEENT: Atraumatic. Conjunctiva clear. No obvious abnormalities on inspection of external nose and ears.  NECK: Normal movements of the head and neck.  LUNGS: On inspection, no signs of respiratory distress. Breathing rate appears normal. No obvious gross SOB, gasping or wheezing, and no conversational dyspnea.  CV: No obvious cyanosis.  PSYCH/NEURO: Pleasant and cooperative. No obvious depression or anxiety. Speech and thought processing grossly intact.  ASSESSMENT AND PLAN:  1. Viral upper respiratory tract infection Discussed home care for viral illness, including rest,  pushing fluids, and OTC medications as needed for symptom relief. Recommend hot tea with honey for sore throat symptoms. If symptoms, esp, dyspnea worsens, recommend in-person evaluation at either an urgent care or the emergency room. She may consider running an additional COVID test this week.  If her symptoms are improving by the end of the week, she should reach out via My Chart for a work release.   I discussed the assessment and treatment plan with the patient. The patient was provided an opportunity to  ask questions and all were answered. The patient agreed with the plan and demonstrated an understanding of the instructions.   The patient was advised to call back or seek an in-person evaluation if the symptoms worsen or if the condition fails to improve as anticipated.   Haydee Salter, MD

## 2021-03-12 DIAGNOSIS — F411 Generalized anxiety disorder: Secondary | ICD-10-CM | POA: Diagnosis not present

## 2021-03-14 ENCOUNTER — Telehealth: Payer: Self-pay | Admitting: Family Medicine

## 2021-03-14 NOTE — Telephone Encounter (Signed)
Patient had a virtual visit with Dr. Gena Fray on 03/11/21 for URI a note was sent to patient via [facility] for patient to return to work on 03/12/21 per patient she's been out since her visit wanting to know if she could have a note form 03/11/21 to 03/15/2021. Please advise

## 2021-03-14 NOTE — Telephone Encounter (Signed)
Patient states that she needs a return to work note. She goes back to work Architectural technologist. It needs to be dated from the date of her appointment up until now. Please call her at [phone] when it's ready to be picked up.

## 2021-03-14 NOTE — Telephone Encounter (Signed)
Returned patients call I see where a note was written for patient to return to work on 03/12/21 checking to see if patient still has symptoms and the reason for no work on the previous note that was written. No answer LMTCB

## 2021-03-15 NOTE — Telephone Encounter (Signed)
Sandra Sparks spoke to patient regarding note and she stated that patient was happy. Dm/cma

## 2021-03-15 NOTE — Telephone Encounter (Signed)
Pt is checking on the status of this message.

## 2021-04-04 DIAGNOSIS — F1220 Cannabis dependence, uncomplicated: Secondary | ICD-10-CM | POA: Diagnosis not present

## 2021-04-04 DIAGNOSIS — F332 Major depressive disorder, recurrent severe without psychotic features: Secondary | ICD-10-CM | POA: Diagnosis not present

## 2021-04-04 DIAGNOSIS — F411 Generalized anxiety disorder: Secondary | ICD-10-CM | POA: Diagnosis not present

## 2021-04-04 DIAGNOSIS — F1010 Alcohol abuse, uncomplicated: Secondary | ICD-10-CM | POA: Diagnosis not present

## 2021-04-24 DIAGNOSIS — F411 Generalized anxiety disorder: Secondary | ICD-10-CM | POA: Diagnosis not present

## 2021-06-28 IMAGING — US US SOFT TISSUE HEAD/NECK
1 series · 7 of 7 positions shown · non-contrast
Comparison: None.

CLINICAL DATA: Posterior neck swelling and tenderness over the last
2 weeks. History of dermoid cyst.

EXAM:
ULTRASOUND OF HEAD/NECK SOFT TISSUES
TECHNIQUE: Ultrasound examination of the head and neck soft tissues was
performed in the area of clinical concern.

[Series 1: us soft tissue head/neck · 7 acquisitions, 7 frames shown]
[im 1/7]
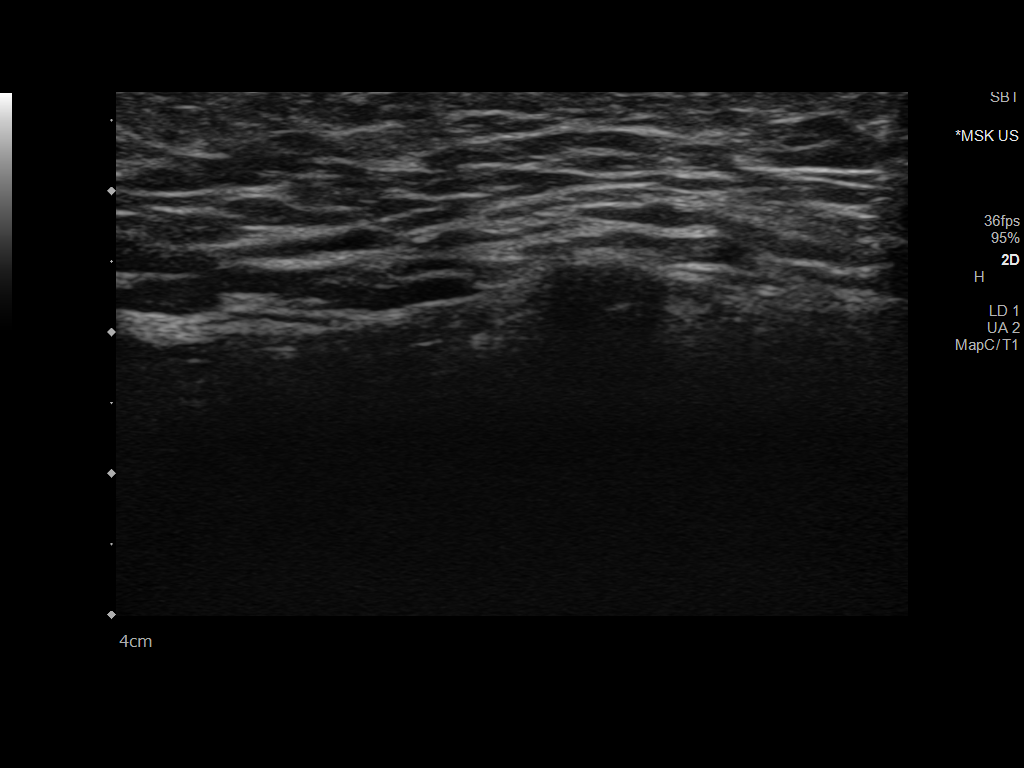
[im 2/7]
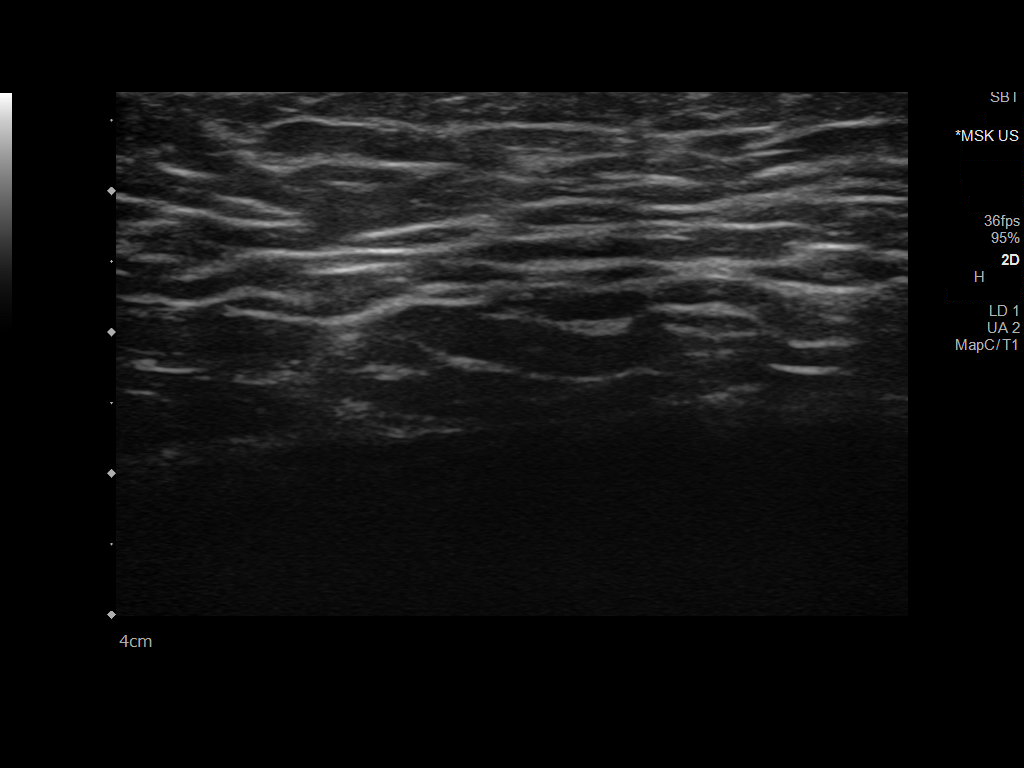
[im 3/7]
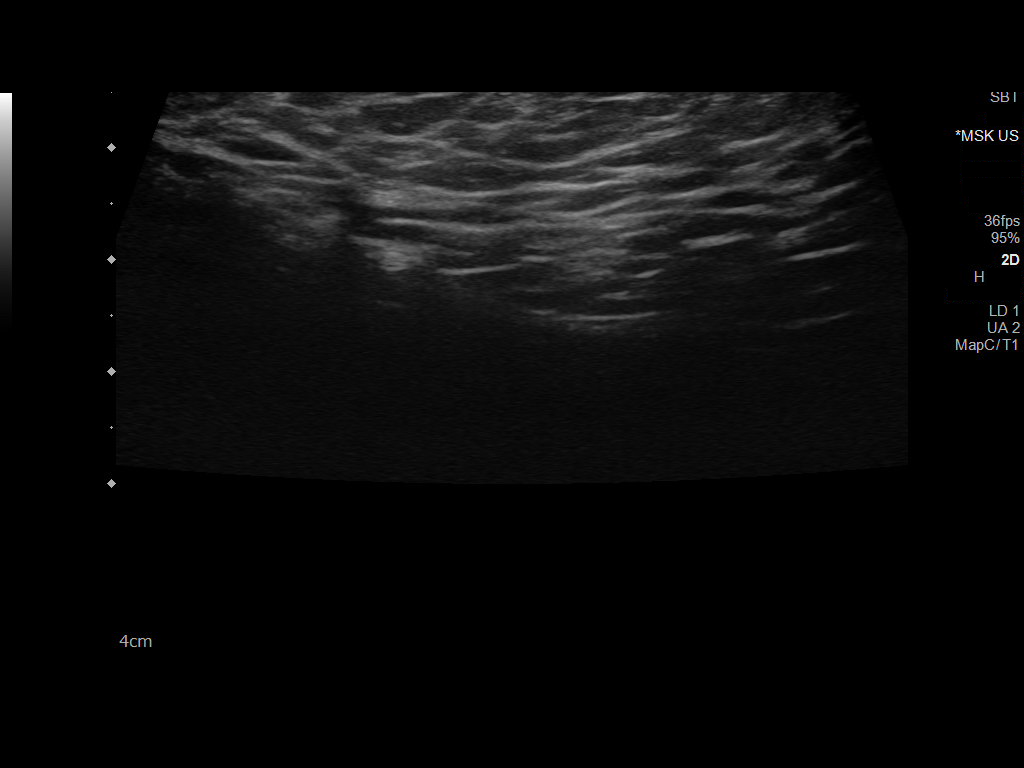
[im 4/7]
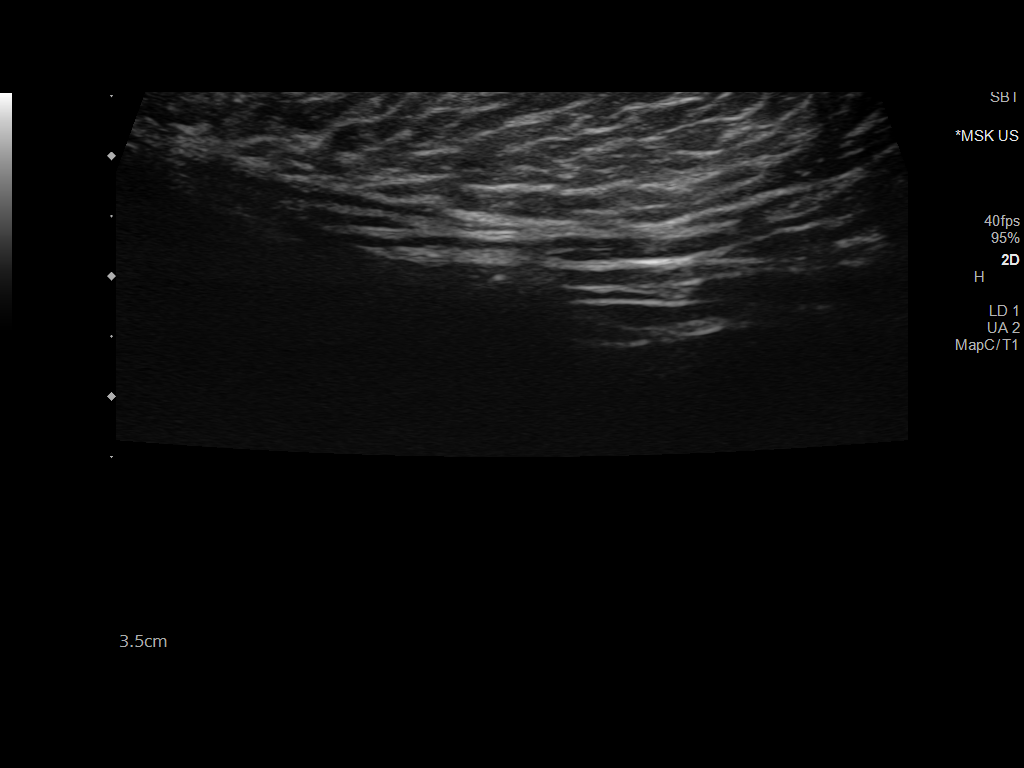
[im 5/7]
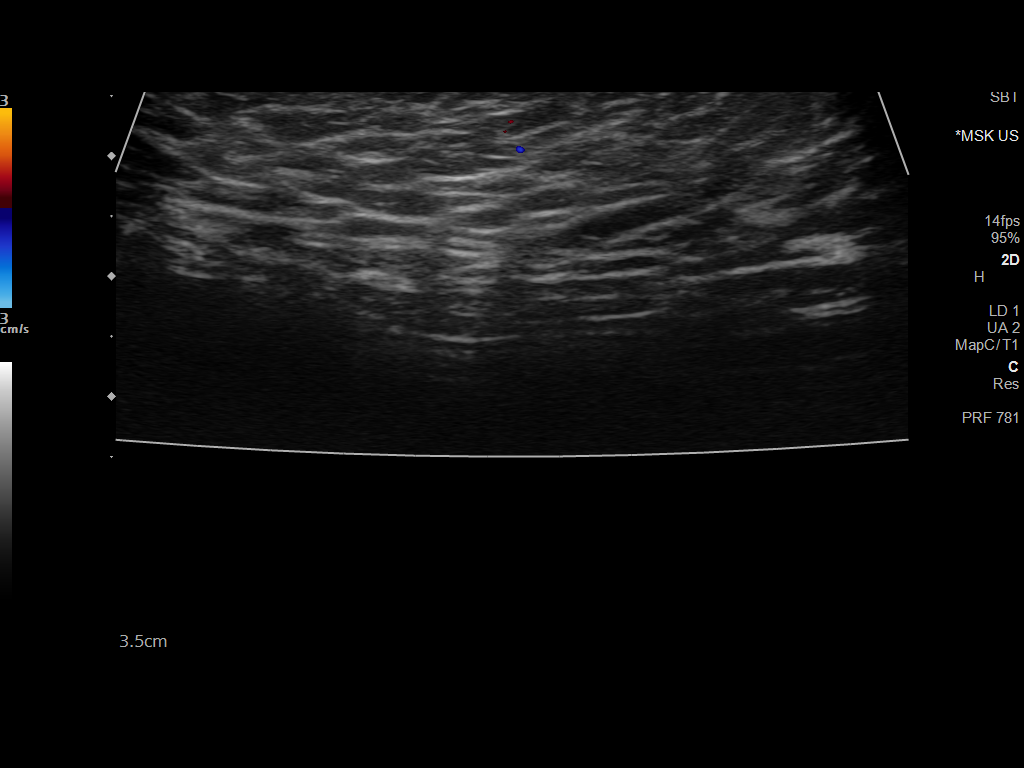
[im 6/7]
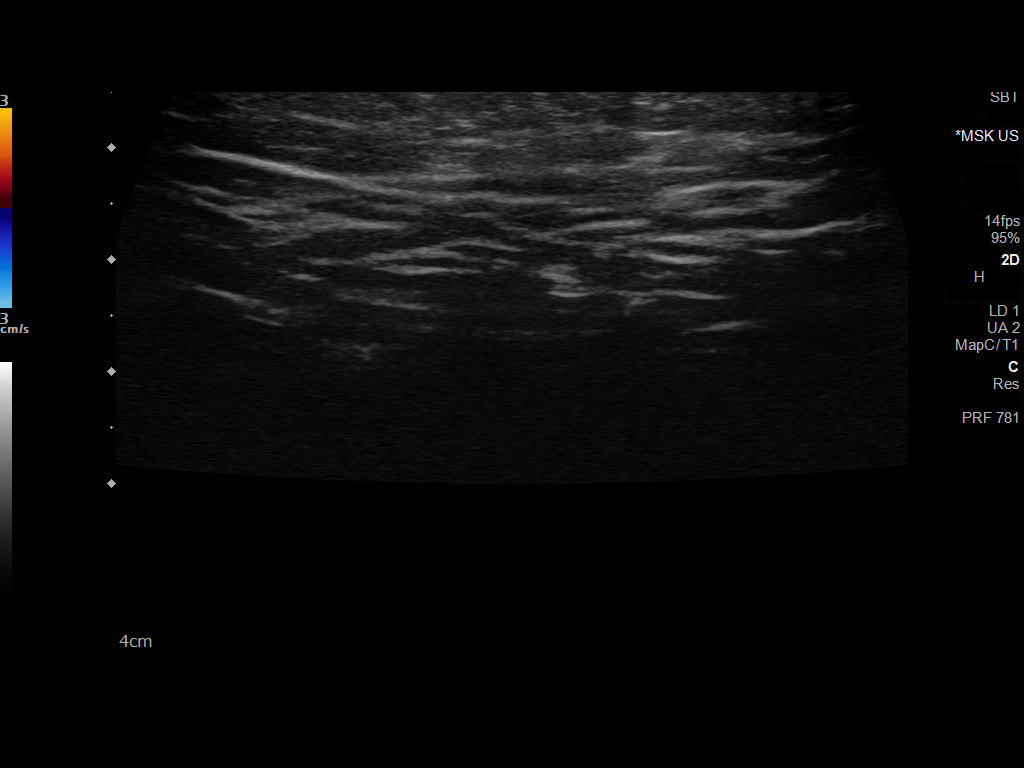
[im 7/7]
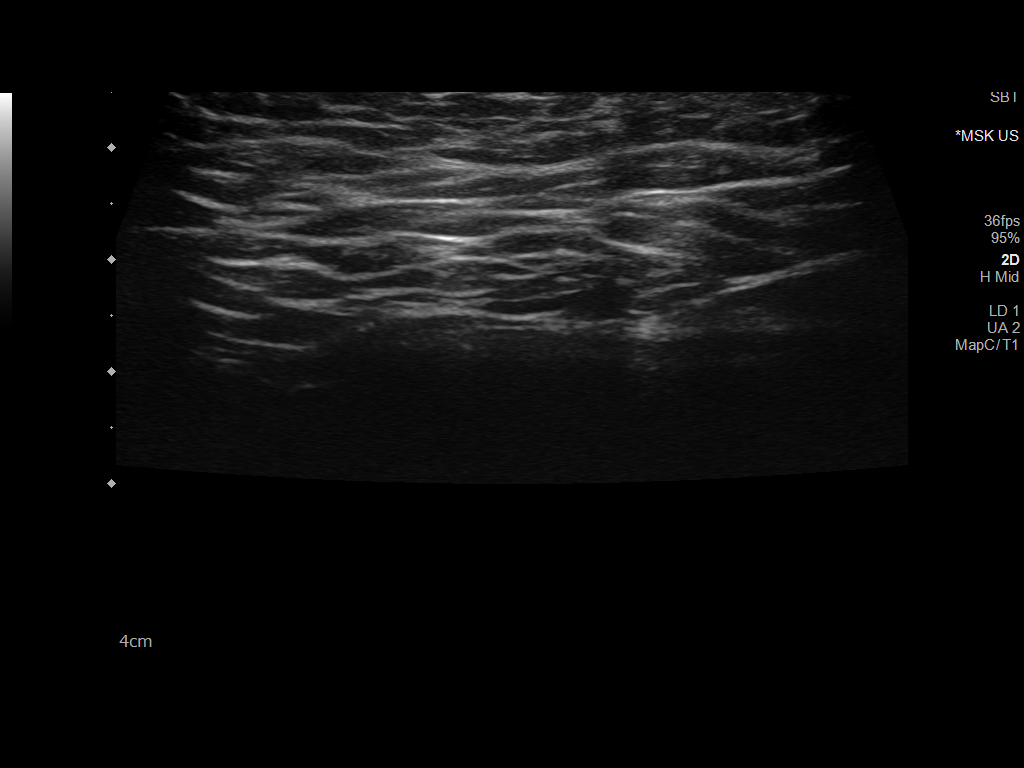

[7 of 7 positions shown; findings below may reference images not displayed]

FINDINGS: Scanning in the region of discomfort shows only grossly normal
appearing subcutaneous fat. No evidence of cyst or mass in the
region.
IMPRESSION: Scanning in the region of discomfort shows only grossly normal
appearing subcutaneous fat. No evidence of cyst or mass in the
region.

## 2021-07-02 DIAGNOSIS — F332 Major depressive disorder, recurrent severe without psychotic features: Secondary | ICD-10-CM | POA: Diagnosis not present

## 2021-07-02 DIAGNOSIS — F411 Generalized anxiety disorder: Secondary | ICD-10-CM | POA: Diagnosis not present

## 2021-07-02 DIAGNOSIS — F1010 Alcohol abuse, uncomplicated: Secondary | ICD-10-CM | POA: Diagnosis not present

## 2021-07-02 DIAGNOSIS — F1220 Cannabis dependence, uncomplicated: Secondary | ICD-10-CM | POA: Diagnosis not present

## 2021-07-11 DIAGNOSIS — F1020 Alcohol dependence, uncomplicated: Secondary | ICD-10-CM | POA: Diagnosis not present

## 2021-07-11 DIAGNOSIS — F411 Generalized anxiety disorder: Secondary | ICD-10-CM | POA: Diagnosis not present

## 2021-07-11 DIAGNOSIS — F1220 Cannabis dependence, uncomplicated: Secondary | ICD-10-CM | POA: Diagnosis not present

## 2021-07-11 DIAGNOSIS — F332 Major depressive disorder, recurrent severe without psychotic features: Secondary | ICD-10-CM | POA: Diagnosis not present

## 2021-07-15 ENCOUNTER — Ambulatory Visit: Payer: BC Managed Care – PPO | Admitting: Family Medicine

## 2021-07-23 ENCOUNTER — Ambulatory Visit: Payer: BC Managed Care – PPO | Admitting: Family Medicine

## 2021-07-23 ENCOUNTER — Other Ambulatory Visit: Payer: Self-pay

## 2021-07-23 VITALS — BP 118/76 | HR 91 | Temp 97.1°F | Ht 65.0 in | Wt 171.4 lb

## 2021-07-23 DIAGNOSIS — Z113 Encounter for screening for infections with a predominantly sexual mode of transmission: Secondary | ICD-10-CM

## 2021-07-23 DIAGNOSIS — M21611 Bunion of right foot: Secondary | ICD-10-CM | POA: Diagnosis not present

## 2021-07-23 DIAGNOSIS — T1490XA Injury, unspecified, initial encounter: Secondary | ICD-10-CM

## 2021-07-23 DIAGNOSIS — F1220 Cannabis dependence, uncomplicated: Secondary | ICD-10-CM | POA: Diagnosis not present

## 2021-07-23 DIAGNOSIS — F1020 Alcohol dependence, uncomplicated: Secondary | ICD-10-CM | POA: Diagnosis not present

## 2021-07-23 DIAGNOSIS — F1920 Other psychoactive substance dependence, uncomplicated: Secondary | ICD-10-CM | POA: Diagnosis not present

## 2021-07-23 DIAGNOSIS — F332 Major depressive disorder, recurrent severe without psychotic features: Secondary | ICD-10-CM | POA: Diagnosis not present
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
## 2021-07-24 LAB — HIV ANTIBODY (ROUTINE TESTING W REFLEX): HIV 1&2 Ab, 4th Generation: NONREACTIVE

## 2021-07-24 LAB — RPR: RPR Ser Ql: NONREACTIVE

## 2021-07-24 LAB — HEPATITIS B SURFACE ANTIGEN: Hepatitis B Surface Ag: NONREACTIVE

## 2021-07-26 LAB — CHLAMYDIA/GC NAA, CONFIRMATION
Chlamydia trachomatis, NAA: NEGATIVE
Neisseria gonorrhoeae, NAA: NEGATIVE

## 2021-07-31 DIAGNOSIS — Z79899 Other long term (current) drug therapy: Secondary | ICD-10-CM | POA: Diagnosis not present

## 2021-07-31 DIAGNOSIS — F332 Major depressive disorder, recurrent severe without psychotic features: Secondary | ICD-10-CM | POA: Diagnosis not present

## 2021-07-31 DIAGNOSIS — F411 Generalized anxiety disorder: Secondary | ICD-10-CM | POA: Diagnosis not present

## 2021-07-31 DIAGNOSIS — F1920 Other psychoactive substance dependence, uncomplicated: Secondary | ICD-10-CM | POA: Diagnosis not present

## 2021-08-06 DIAGNOSIS — F1010 Alcohol abuse, uncomplicated: Secondary | ICD-10-CM | POA: Diagnosis not present

## 2021-08-06 DIAGNOSIS — F1220 Cannabis dependence, uncomplicated: Secondary | ICD-10-CM | POA: Diagnosis not present

## 2021-08-06 DIAGNOSIS — F1920 Other psychoactive substance dependence, uncomplicated: Secondary | ICD-10-CM | POA: Diagnosis not present

## 2021-08-06 DIAGNOSIS — F332 Major depressive disorder, recurrent severe without psychotic features: Secondary | ICD-10-CM | POA: Diagnosis not present

## 2021-08-07 ENCOUNTER — Ambulatory Visit: Payer: BC Managed Care – PPO | Admitting: Podiatry

## 2021-08-07 ENCOUNTER — Other Ambulatory Visit: Payer: Self-pay

## 2021-08-20 ENCOUNTER — Other Ambulatory Visit: Payer: Self-pay

## 2021-08-20 ENCOUNTER — Ambulatory Visit: Payer: BC Managed Care – PPO | Attending: Family Medicine | Admitting: Physical Therapy

## 2021-08-20 DIAGNOSIS — M25674 Stiffness of right foot, not elsewhere classified: Secondary | ICD-10-CM | POA: Insufficient documentation

## 2021-08-20 DIAGNOSIS — M6281 Muscle weakness (generalized): Secondary | ICD-10-CM | POA: Diagnosis not present

## 2021-08-20 DIAGNOSIS — T1490XA Injury, unspecified, initial encounter: Secondary | ICD-10-CM | POA: Diagnosis not present

## 2021-08-20 DIAGNOSIS — R262 Difficulty in walking, not elsewhere classified: Secondary | ICD-10-CM | POA: Insufficient documentation

## 2021-08-20 DIAGNOSIS — R2689 Other abnormalities of gait and mobility: Secondary | ICD-10-CM | POA: Insufficient documentation

## 2021-08-20 DIAGNOSIS — M25571 Pain in right ankle and joints of right foot: Secondary | ICD-10-CM | POA: Diagnosis not present
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
## 2021-08-21 DIAGNOSIS — F1220 Cannabis dependence, uncomplicated: Secondary | ICD-10-CM | POA: Diagnosis not present

## 2021-08-21 DIAGNOSIS — F332 Major depressive disorder, recurrent severe without psychotic features: Secondary | ICD-10-CM | POA: Diagnosis not present

## 2021-08-21 DIAGNOSIS — F1920 Other psychoactive substance dependence, uncomplicated: Secondary | ICD-10-CM | POA: Diagnosis not present

## 2021-08-21 DIAGNOSIS — F1010 Alcohol abuse, uncomplicated: Secondary | ICD-10-CM | POA: Diagnosis not present

## 2021-08-26 NOTE — Addendum Note (Signed)
Addended by: Colbert Ewing MARIE L on: 08/26/2021 12:46 PM   Modules accepted: Orders

## 2021-08-27 ENCOUNTER — Other Ambulatory Visit: Payer: Self-pay

## 2021-08-27 ENCOUNTER — Ambulatory Visit: Payer: BC Managed Care – PPO | Admitting: Physical Therapy

## 2021-08-27 DIAGNOSIS — R2689 Other abnormalities of gait and mobility: Secondary | ICD-10-CM | POA: Diagnosis not present

## 2021-08-27 DIAGNOSIS — M25571 Pain in right ankle and joints of right foot: Secondary | ICD-10-CM | POA: Diagnosis not present

## 2021-08-27 DIAGNOSIS — T1490XA Injury, unspecified, initial encounter: Secondary | ICD-10-CM | POA: Diagnosis not present

## 2021-08-27 DIAGNOSIS — R262 Difficulty in walking, not elsewhere classified: Secondary | ICD-10-CM | POA: Diagnosis not present

## 2021-08-27 DIAGNOSIS — M6281 Muscle weakness (generalized): Secondary | ICD-10-CM | POA: Diagnosis not present

## 2021-08-27 DIAGNOSIS — M25674 Stiffness of right foot, not elsewhere classified: Secondary | ICD-10-CM | POA: Diagnosis not present

## 2021-08-27 NOTE — Therapy (Signed)
[place]. [place], [place], [address] Phone: [phone]   Fax:  [phone]  Physical Therapy Treatment  Patient Details  Name: Sandra Sparks MRN: [id number] Date of Birth: 1980/12/09 Referring Provider (PT): Haydee Salter, MD   Encounter Date: 08/27/2021   PT End of Session - 08/27/21 1000     Visit Number 2    Number of Visits 6    Date for PT Re-Evaluation 10/01/21    Authorization Type BCBS    PT Start Time [phone]    PT Stop Time 1000    PT Time Calculation (min) 35 min             Past Medical History:  Diagnosis Date   Depression    Ovarian cyst     Past Surgical History:  Procedure Laterality Date   BACK SURGERY      There were no vitals filed for this visit.   Subjective Assessment - 08/27/21 0932     Subjective felt better after eval with DN.have not done HEP yet. foot is good mostly just thigh    Currently in Pain? Yes    Pain Score 0-No pain                               [facility] Adult PT Treatment/Exercise - 08/27/21 0001       Exercises   Exercises Knee/Hip;Ankle      Knee/Hip Exercises: Aerobic   Recumbent Bike L 4 15min   several breaks needed     Knee/Hip Exercises: Machines for Strengthening   Cybex Knee Extension RT LE 5# 2 sets 10    Cybex Leg Press 30 # 2 sets 10      Knee/Hip Exercises: Standing   Heel Raises Both;15 reps   on black bar plus toe raises   Other Standing Knee Exercises RT LE red tband 15 x each   SL flex,flex with ER,abd and ext             Trigger Point Dry Needling - 08/27/21 0001     Consent Given? Yes    Education Handout Provided Previously provided    Dry Needling Comments provided by [facility] certified clinician    Rectus femoris Response Twitch response elicited;Palpable increased muscle length    Vastus medialis Response Twitch response elicited;Palpable increased muscle length   2nd insertion rolled off femur    Vastus intermedius Response Twitch response elicited;Palpable increased muscle length                        PT Long Term Goals - 08/27/21 0947       PT LONG TERM GOAL #1   Title Pt will be independent with HEP    Baseline pt states no issue but has not performed    Status On-going      PT LONG TERM GOAL #2   Title Pt will report no pain in medial thigh with her gym activities (walks treadmill)    Status On-going      PT LONG TERM GOAL #3   Title Pt will be able to wear heels with </=2/10 pain for 20 minutes    Status On-going      PT LONG TERM GOAL #4   Title Pt will be able to stand on R LE for at least 30 sec to demo improved foot/ankle/knee stability  Status New                   Plan - 08/27/21 0948     Clinical Impression Statement pt very pleased with eval -refief from DN and requested to do it again. Performed with continued decrease in quad trigger point/bulge. initiated ther ex and weakness and burning noted with ex- several rest on 5 min bike and shaking with isolated hip flex and quad work with cuing for speed and control. educ on rolling thigh at home with frozen water bottle and adding bike,leg press and knee ext in at gym.    PT Treatment/Interventions ADLs/Self Care Home Management;Electrical Stimulation;Iontophoresis 4mg /ml Dexamethasone;Moist Heat;Ultrasound;Gait training;Stair training;Functional mobility training;Therapeutic activities;Therapeutic exercise;Balance training;Neuromuscular re-education;Manual techniques;Patient/family education;Passive range of motion;Dry needling;Taping    PT Next Visit Plan Initiate bilat foot strengthening (arch strengtheners, toe extension/flexion, big toe abduction). Continue PF strengthening.but pt cheif complaint is thigh - good results with DN but weakness noted as well- encouraged HEP compliance and will add once doing inital HEP. See how gym ex are going             Patient will benefit from  skilled therapeutic intervention in order to improve the following deficits and impairments:  Increased fascial restricitons, Increased muscle spasms, Pain, Hypomobility, Decreased mobility, Decreased strength  Visit Diagnosis: Difficulty in walking, not elsewhere classified  Muscle weakness (generalized)     Problem List Patient Active Problem List   Diagnosis Date Noted   Anemia 12/04/2020   Lipoma of right lower extremity 01/06/2020   Acne vulgaris 01/06/2020   Dysmenorrhea 01/06/2020   Post traumatic stress disorder 01/06/2020   Screen for STD (sexually transmitted disease) 01/06/2020   Dermoid cyst 06/12/2016   Pelvic pain in female 06/12/2016    [facility] April Gordy Levan, PT, DPT 08/27/2021, 12:08 PM  [place]. [place], [place], [address] Phone: [phone]   Fax:  [phone]  Name: Sandra Sparks MRN: [id number] Date of Birth: [birth year]

## 2021-08-28 DIAGNOSIS — F332 Major depressive disorder, recurrent severe without psychotic features: Secondary | ICD-10-CM | POA: Diagnosis not present

## 2021-08-28 DIAGNOSIS — F1010 Alcohol abuse, uncomplicated: Secondary | ICD-10-CM | POA: Diagnosis not present

## 2021-08-28 DIAGNOSIS — F411 Generalized anxiety disorder: Secondary | ICD-10-CM | POA: Diagnosis not present

## 2021-08-28 DIAGNOSIS — F1220 Cannabis dependence, uncomplicated: Secondary | ICD-10-CM | POA: Diagnosis not present

## 2021-08-29 DIAGNOSIS — F1220 Cannabis dependence, uncomplicated: Secondary | ICD-10-CM | POA: Diagnosis not present

## 2021-08-29 DIAGNOSIS — F332 Major depressive disorder, recurrent severe without psychotic features: Secondary | ICD-10-CM | POA: Diagnosis not present

## 2021-08-29 DIAGNOSIS — F411 Generalized anxiety disorder: Secondary | ICD-10-CM | POA: Diagnosis not present

## 2021-08-29 DIAGNOSIS — F1010 Alcohol abuse, uncomplicated: Secondary | ICD-10-CM | POA: Diagnosis not present

## 2021-09-03 ENCOUNTER — Ambulatory Visit: Payer: BC Managed Care – PPO | Admitting: Physical Therapy

## 2021-09-03 ENCOUNTER — Other Ambulatory Visit: Payer: Self-pay

## 2021-09-03 DIAGNOSIS — R262 Difficulty in walking, not elsewhere classified: Secondary | ICD-10-CM | POA: Diagnosis not present

## 2021-09-03 DIAGNOSIS — M25674 Stiffness of right foot, not elsewhere classified: Secondary | ICD-10-CM

## 2021-09-03 DIAGNOSIS — M25571 Pain in right ankle and joints of right foot: Secondary | ICD-10-CM | POA: Diagnosis not present

## 2021-09-03 DIAGNOSIS — T1490XA Injury, unspecified, initial encounter: Secondary | ICD-10-CM | POA: Diagnosis not present

## 2021-09-03 DIAGNOSIS — M6281 Muscle weakness (generalized): Secondary | ICD-10-CM

## 2021-09-03 DIAGNOSIS — R2689 Other abnormalities of gait and mobility: Secondary | ICD-10-CM
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
## 2021-09-04 DIAGNOSIS — F1920 Other psychoactive substance dependence, uncomplicated: Secondary | ICD-10-CM | POA: Diagnosis not present

## 2021-09-04 DIAGNOSIS — F1020 Alcohol dependence, uncomplicated: Secondary | ICD-10-CM | POA: Diagnosis not present

## 2021-09-04 DIAGNOSIS — F332 Major depressive disorder, recurrent severe without psychotic features: Secondary | ICD-10-CM | POA: Diagnosis not present

## 2021-09-04 DIAGNOSIS — F1220 Cannabis dependence, uncomplicated: Secondary | ICD-10-CM | POA: Diagnosis not present

## 2021-09-12 DIAGNOSIS — F1920 Other psychoactive substance dependence, uncomplicated: Secondary | ICD-10-CM | POA: Diagnosis not present

## 2021-09-12 DIAGNOSIS — F332 Major depressive disorder, recurrent severe without psychotic features: Secondary | ICD-10-CM | POA: Diagnosis not present

## 2021-09-12 DIAGNOSIS — F1020 Alcohol dependence, uncomplicated: Secondary | ICD-10-CM | POA: Diagnosis not present

## 2021-09-12 DIAGNOSIS — F1220 Cannabis dependence, uncomplicated: Secondary | ICD-10-CM | POA: Diagnosis not present

## 2021-09-18 DIAGNOSIS — F603 Borderline personality disorder: Secondary | ICD-10-CM | POA: Diagnosis not present

## 2021-09-18 DIAGNOSIS — F3181 Bipolar II disorder: Secondary | ICD-10-CM | POA: Diagnosis not present

## 2021-09-18 DIAGNOSIS — F1220 Cannabis dependence, uncomplicated: Secondary | ICD-10-CM | POA: Diagnosis not present

## 2021-09-18 DIAGNOSIS — F411 Generalized anxiety disorder: Secondary | ICD-10-CM | POA: Diagnosis not present

## 2021-09-18 DIAGNOSIS — F1010 Alcohol abuse, uncomplicated: Secondary | ICD-10-CM | POA: Diagnosis not present

## 2021-09-18 DIAGNOSIS — F332 Major depressive disorder, recurrent severe without psychotic features: Secondary | ICD-10-CM | POA: Diagnosis not present

## 2021-09-19 ENCOUNTER — Telehealth (HOSPITAL_COMMUNITY): Payer: Self-pay | Admitting: Psychiatry

## 2021-09-19 NOTE — Telephone Encounter (Signed)
D:  Pt phoned about MH-IOP.  A:  Oriented pt to MH-IOP.  CCA scheduled for 09-24-21 @ 9:30 a.m.  R: Pt receptive.

## 2021-09-23 ENCOUNTER — Ambulatory Visit: Payer: BC Managed Care – PPO | Admitting: Podiatry

## 2021-09-24 ENCOUNTER — Other Ambulatory Visit: Payer: Self-pay

## 2021-09-24 ENCOUNTER — Telehealth (HOSPITAL_COMMUNITY): Payer: Self-pay | Admitting: Psychiatry

## 2021-09-24 ENCOUNTER — Other Ambulatory Visit (HOSPITAL_COMMUNITY): Payer: BC Managed Care – PPO | Attending: Psychiatry | Admitting: Psychiatry
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
## 2021-09-26 ENCOUNTER — Other Ambulatory Visit: Payer: Self-pay

## 2021-09-26 ENCOUNTER — Ambulatory Visit (INDEPENDENT_AMBULATORY_CARE_PROVIDER_SITE_OTHER): Payer: BC Managed Care – PPO

## 2021-09-26 ENCOUNTER — Ambulatory Visit: Payer: BC Managed Care – PPO | Admitting: Podiatry

## 2021-09-26 DIAGNOSIS — M2011 Hallux valgus (acquired), right foot: Secondary | ICD-10-CM

## 2021-09-26 DIAGNOSIS — M21611 Bunion of right foot: Secondary | ICD-10-CM | POA: Diagnosis not present

## 2021-09-26 DIAGNOSIS — M21619 Bunion of unspecified foot: Secondary | ICD-10-CM

## 2021-09-26 NOTE — Patient Instructions (Signed)
Look for Voltaren gel at the pharmacy over the counter or online (also known as diclofenac 1% gel). Apply to the painful areas 3-4x daily with the supplied dosing card. Allow to dry for 10 minutes before going into socks/shoes   Check Amazon for silicone bunion pads/shields, or a bunion corrector to try

## 2021-09-29 ENCOUNTER — Encounter: Payer: Self-pay | Admitting: Podiatry

## 2021-09-29 NOTE — Progress Notes (Signed)
°  Subjective:  Patient ID: Sandra Sparks, female    DOB: 29-Mar-1981,  MRN: [id number]  Chief Complaint  Patient presents with   Bunions    Right foot bunion causing discomfort  PT stated that she is unable to wear heels anymore     41 y.o. female presents with the above complaint. History confirmed with patient.  Bunion is starting to become painful and especially in shoe gear.  Objective:  Physical Exam: warm, good capillary refill, no trophic changes or ulcerative lesions, normal DP and PT pulses, and normal sensory exam. Left Foot: normal exam, no swelling, tenderness, instability; ligaments intact, full range of motion of all ankle/foot joints Right Foot: soft tissue swelling noted over the medial eminence of the first metatarsal and bunion deformity noted  No images are attached to the encounter.  Radiographs: Multiple views x-ray of the right foot: no fracture, dislocation, swelling or degenerative changes noted and moderate hallux valgus deformity  Assessment:   1. Hallux valgus with bunions, right      Plan:  Patient was evaluated and treated and all questions answered.  discussed etiology and treatment options of bunions including surgical and nonsurgical treatment.  Discussed for surgical treatment I would require her to be non-smoking 6 weeks before and after surgery.  Discussed offloading with silicone pads and I showed her a few of these today and she will try this for now.  She will return in about 8 weeks to see how she is doing and she will work on quitting smoking for now.  Return in about 8 weeks (around 11/21/2021) for recheck bunion .

## 2021-10-01 DIAGNOSIS — F603 Borderline personality disorder: Secondary | ICD-10-CM | POA: Diagnosis not present

## 2021-10-01 DIAGNOSIS — F332 Major depressive disorder, recurrent severe without psychotic features: Secondary | ICD-10-CM | POA: Diagnosis not present

## 2021-10-01 DIAGNOSIS — F1020 Alcohol dependence, uncomplicated: Secondary | ICD-10-CM | POA: Diagnosis not present

## 2021-10-01 DIAGNOSIS — F411 Generalized anxiety disorder: Secondary | ICD-10-CM | POA: Diagnosis not present

## 2021-10-29 DIAGNOSIS — F1020 Alcohol dependence, uncomplicated: Secondary | ICD-10-CM | POA: Diagnosis not present

## 2021-10-29 DIAGNOSIS — F332 Major depressive disorder, recurrent severe without psychotic features: Secondary | ICD-10-CM | POA: Diagnosis not present

## 2021-10-29 DIAGNOSIS — F411 Generalized anxiety disorder: Secondary | ICD-10-CM | POA: Diagnosis not present

## 2021-10-29 DIAGNOSIS — F603 Borderline personality disorder: Secondary | ICD-10-CM | POA: Diagnosis not present

## 2021-11-14 DIAGNOSIS — F1920 Other psychoactive substance dependence, uncomplicated: Secondary | ICD-10-CM | POA: Diagnosis not present

## 2021-11-14 DIAGNOSIS — F1020 Alcohol dependence, uncomplicated: Secondary | ICD-10-CM | POA: Diagnosis not present

## 2021-11-14 DIAGNOSIS — F3181 Bipolar II disorder: Secondary | ICD-10-CM | POA: Diagnosis not present

## 2021-11-14 DIAGNOSIS — F1220 Cannabis dependence, uncomplicated: Secondary | ICD-10-CM | POA: Diagnosis not present

## 2021-11-19 ENCOUNTER — Other Ambulatory Visit: Payer: Self-pay

## 2021-11-19 ENCOUNTER — Encounter: Payer: Self-pay | Admitting: Family Medicine

## 2021-11-19 ENCOUNTER — Ambulatory Visit: Payer: BC Managed Care – PPO | Admitting: Family Medicine

## 2021-11-19 VITALS — BP 128/72 | HR 94 | Temp 97.9°F | Ht 65.0 in | Wt 172.8 lb

## 2021-11-19 DIAGNOSIS — Z8719 Personal history of other diseases of the digestive system: Secondary | ICD-10-CM | POA: Diagnosis not present

## 2021-11-19 DIAGNOSIS — Z23 Encounter for immunization: Secondary | ICD-10-CM

## 2021-11-19 NOTE — Progress Notes (Signed)
? ?Established Patient Office Visit ? ?Subjective:  ?Patient ID: Sandra Sparks, female    DOB: Dec 09, 1980  Age: 41 y.o. MRN: [id number] ? ?CC:  ?Chief Complaint  ?Patient presents with  ? Rectal Bleeding  ?  Blood in stool x 4 days no other symptoms no blood today.   ? ? ?HPI ?Sandra Sparks presents for evaluation status post 4-day history of hematochezia.  By coincidence her cycle started 4 days ago.  She believes that the 2 are not related.  She denies history of constipation, hard and painful stools, rectal masses, weight loss or night sweats. No change in stooling patterns.  There has been no melena.  No family history of colon disease.  She feels well.  ? ?Past Medical History:  ?Diagnosis Date  ? Depression   ? Ovarian cyst   ? ? ?Past Surgical History:  ?Procedure Laterality Date  ? BACK SURGERY    ? ? ?Family History  ?Problem Relation Age of Onset  ? Diabetes Maternal Grandmother   ? ? ?Social History  ? ?Socioeconomic History  ? Marital status: Single  ?  Spouse name: Not on file  ? Number of children: Not on file  ? Years of education: Not on file  ? Highest education level: Not on file  ?Occupational History  ? Not on file  ?Tobacco Use  ? Smoking status: Every Day  ?  Packs/day: 0.25  ?  Types: Cigarettes  ? Smokeless tobacco: Never  ?Vaping Use  ? Vaping Use: Never used  ?Substance and Sexual Activity  ? Alcohol use: Yes  ?  Comment: 3 x week  ? Drug use: Not Currently  ? Sexual activity: Not Currently  ?  Birth control/protection: Surgical  ?Other Topics Concern  ? Not on file  ?Social History Narrative  ? Not on file  ? ?Social Determinants of Health  ? ?Financial Resource Strain: Not on file  ?Food Insecurity: Not on file  ?Transportation Needs: Not on file  ?Physical Activity: Not on file  ?Stress: Not on file  ?Social Connections: Not on file  ?Intimate Partner Violence: Not on file  ? ? ?Outpatient Medications Prior to Visit  ?Medication Sig Dispense Refill  ? risperiDONE (RISPERDAL) 0.5 MG tablet  Take 0.5 mg by mouth daily.    ? venlafaxine XR (EFFEXOR-XR) 75 MG 24 hr capsule Take 75 mg by mouth daily.    ? ARIPiprazole (ABILIFY) 2 MG tablet Take 2 mg by mouth daily.    ? ferrous sulfate 220 (44 Fe) MG/5ML solution Take 5 mLs (220 mg total) by mouth 2 (two) times daily with a meal. 150 mL 3  ? gabapentin (NEURONTIN) 300 MG capsule Take by mouth.    ? glycopyrrolate (ROBINUL) 1 MG tablet Take 1 mg by mouth 2 (two) times daily.    ? ?No facility-administered medications prior to visit.  ? ? ?Allergies  ?Allergen Reactions  ? Penicillins Other (See Comments)  ?  Unsure ?  ? ? ?ROS ?Review of Systems  ?Constitutional:  Negative for chills, diaphoresis, fatigue, fever and unexpected weight change.  ?Respiratory: Negative.    ?Cardiovascular: Negative.   ?Gastrointestinal:  Positive for blood in stool. Negative for abdominal pain, anal bleeding, constipation, diarrhea, nausea and vomiting.  ?Genitourinary:  Positive for vaginal bleeding.  ?Neurological:  Negative for speech difficulty, weakness and light-headedness.  ? ?  ?Objective:  ?  ?Physical Exam ?Vitals and nursing note reviewed.  ?Constitutional:   ?   General: She is  not in acute distress. ?   Appearance: Normal appearance. She is not ill-appearing, toxic-appearing or diaphoretic.  ?HENT:  ?   Head: Normocephalic and atraumatic.  ?   Right Ear: External ear normal.  ?   Left Ear: External ear normal.  ?   Mouth/Throat:  ?   Mouth: Mucous membranes are moist.  ?   Pharynx: Oropharynx is clear. No oropharyngeal exudate or posterior oropharyngeal erythema.  ?Eyes:  ?   Extraocular Movements: Extraocular movements intact.  ?   Conjunctiva/sclera: Conjunctivae normal.  ?   Pupils: Pupils are equal, round, and reactive to light.  ?Neck:  ?   Vascular: No carotid bruit.  ?Cardiovascular:  ?   Rate and Rhythm: Normal rate and regular rhythm.  ?Pulmonary:  ?   Effort: Pulmonary effort is normal.  ?   Breath sounds: Normal breath sounds.  ?Abdominal:  ?   General:  Abdomen is flat. Bowel sounds are normal. There is no distension.  ?   Palpations: Abdomen is soft. There is no mass.  ?   Tenderness: There is no abdominal tenderness. There is no guarding or rebound.  ?Genitourinary: ?   Rectum: Guaiac result negative. No mass, tenderness, anal fissure or internal hemorrhoid. Normal anal tone.  ?   Comments: Anal tags present.  No evidence of thrombosis. ?Musculoskeletal:  ?   Cervical back: No rigidity or tenderness.  ?Lymphadenopathy:  ?   Cervical: No cervical adenopathy.  ?Neurological:  ?   Mental Status: She is alert and oriented to person, place, and time.  ?Psychiatric:     ?   Mood and Affect: Mood normal.     ?   Behavior: Behavior normal.  ? ? ?BP 128/72 (BP Location: Right Arm, Patient Position: Sitting, Cuff Size: Normal)   Pulse 94   Temp 97.9 ?F (36.6 ?C) (Temporal)   Ht '5\' 5"'$  (1.651 m)   Wt 172 lb 12.8 oz (78.4 kg)   SpO2 96%   BMI 28.76 kg/m?  ?Wt Readings from Last 3 Encounters:  ?11/19/21 172 lb 12.8 oz (78.4 kg)  ?07/23/21 171 lb 6.4 oz (77.7 kg)  ?03/11/21 165 lb (74.8 kg)  ? ? ? ?Health Maintenance Due  ?Topic Date Due  ? TETANUS/TDAP  Never done  ? ? ?There are no preventive care reminders to display for this patient. ? ?Lab Results  ?Component Value Date  ? TSH 0.47 01/25/2020  ? ?Lab Results  ?Component Value Date  ? WBC 8.8 12/04/2020  ? HGB 12.6 12/04/2020  ? HCT 38.3 12/04/2020  ? MCV 86.9 12/04/2020  ? PLT 267.0 12/04/2020  ? ?Lab Results  ?Component Value Date  ? NA 138 01/25/2020  ? K 4.3 01/25/2020  ? CO2 28 01/25/2020  ? GLUCOSE 108 (H) 01/25/2020  ? BUN 8 01/25/2020  ? CREATININE 0.90 01/25/2020  ? BILITOT 0.2 01/25/2020  ? ALKPHOS 61 01/25/2020  ? AST 2 01/25/2020  ? ALT 9 01/25/2020  ? PROT 6.6 01/25/2020  ? ALBUMIN 4.1 01/25/2020  ? CALCIUM 8.8 01/25/2020  ? ANIONGAP 8 12/09/2017  ? GFR 83.35 01/25/2020  ? ?Lab Results  ?Component Value Date  ? CHOL 212 (H) 01/25/2020  ? ?Lab Results  ?Component Value Date  ? HDL 65.80 01/25/2020  ? ?Lab  Results  ?Component Value Date  ? LDLCALC 118 (H) 01/25/2020  ? ?Lab Results  ?Component Value Date  ? TRIG 137.0 01/25/2020  ? ?Lab Results  ?Component Value Date  ? CHOLHDL 3  01/25/2020  ? ?No results found for: HGBA1C ? ?  ?Assessment & Plan:  ? ?Problem List Items Addressed This Visit   ?None ?Visit Diagnoses   ? ? Need for influenza vaccination    -  Primary  ? Relevant Orders  ? Tdap vaccine greater than or equal to 7yo IM  ? ?  ? ? ?No orders of the defined types were placed in this encounter. ? ? ?Follow-up: No follow-ups on file.  ?With heme-negative stool today and essentially normal rectal exam, will recommend reassurance.  If there is another recurrence she is to let me know and will refer her for colonoscopy.   ? ?Libby Maw, MD ?

## 2021-11-21 ENCOUNTER — Ambulatory Visit: Payer: BC Managed Care – PPO | Admitting: Podiatry

## 2021-12-03 DIAGNOSIS — F1220 Cannabis dependence, uncomplicated: Secondary | ICD-10-CM | POA: Diagnosis not present

## 2021-12-03 DIAGNOSIS — F411 Generalized anxiety disorder: Secondary | ICD-10-CM | POA: Diagnosis not present

## 2021-12-03 DIAGNOSIS — F332 Major depressive disorder, recurrent severe without psychotic features: Secondary | ICD-10-CM | POA: Diagnosis not present

## 2021-12-03 DIAGNOSIS — F1010 Alcohol abuse, uncomplicated: Secondary | ICD-10-CM | POA: Diagnosis not present

## 2022-04-28 ENCOUNTER — Ambulatory Visit (INDEPENDENT_AMBULATORY_CARE_PROVIDER_SITE_OTHER): Payer: 59 | Admitting: Family Medicine

## 2022-04-28 ENCOUNTER — Encounter: Payer: Self-pay | Admitting: Family Medicine

## 2022-04-28 VITALS — BP 134/74 | HR 95 | Temp 97.6°F | Ht 65.0 in | Wt 181.4 lb

## 2022-04-28 DIAGNOSIS — H60332 Swimmer's ear, left ear: Secondary | ICD-10-CM | POA: Insufficient documentation

## 2022-04-28 DIAGNOSIS — J310 Chronic rhinitis: Secondary | ICD-10-CM | POA: Diagnosis not present

## 2022-04-28 DIAGNOSIS — Z23 Encounter for immunization: Secondary | ICD-10-CM | POA: Diagnosis not present

## 2022-04-28 MED ORDER — KETOROLAC TROMETHAMINE 60 MG/2ML IM SOLN
60.0000 mg | Freq: Once | INTRAMUSCULAR | Status: AC
  Administered 2022-04-28: 60 mg via INTRAMUSCULAR

## 2022-04-28 MED ORDER — NEOMYCIN-POLYMYXIN-HC 3.5-10000-1 OT SUSP: 4.0000 [drp] | Freq: Four times a day (QID) | OTIC | 0 refills | Status: DC

## 2022-04-28 NOTE — Progress Notes (Signed)
Established Patient Office Visit  Subjective   Patient ID: Sandra Sparks, female    DOB: 1981-01-16  Age: 41 y.o. MRN: [id number]  Chief Complaint  Patient presents with   Ear Pain    Left ear pain x 6 days.  Vwould also like something to help lose weight.     HPI evaluation and treatment of a 5 to 6-day history of left ear pain.  Hearing is affected.  She has had no cold or flu symptoms but has been experiencing some nasal congestion postnasal drip and sneezing.  She says that these are her usual allergy symptoms.  She has been swimming recently.  She did gain weight with treatment of risperidone for her bipolar disorder.  Provider discontinued that medicine and put her on Topamax.  She is interested in medication to help her lose the weight she had gained.    Review of Systems  Constitutional:  Negative for chills, diaphoresis, malaise/fatigue and weight loss.  HENT:  Positive for congestion, ear pain and hearing loss. Negative for ear discharge, nosebleeds, sore throat and tinnitus.   Eyes: Negative.  Negative for blurred vision and double vision.  Respiratory: Negative.    Cardiovascular:  Negative for chest pain.  Gastrointestinal:  Negative for abdominal pain.  Genitourinary: Negative.   Musculoskeletal:  Negative for falls and myalgias.  Neurological:  Negative for speech change, loss of consciousness and weakness.  Psychiatric/Behavioral: Negative.        Objective:     BP 134/74 (BP Location: Right Arm, Patient Position: Sitting, Cuff Size: Large)   Pulse 95   Temp 97.6 F (36.4 C) (Temporal)   Ht '5\' 5"'$  (1.651 m)   Wt 181 lb 6.4 oz (82.3 kg)   SpO2 98%   BMI 30.19 kg/m    Physical Exam Constitutional:      General: She is not in acute distress.    Appearance: Normal appearance. She is not ill-appearing, toxic-appearing or diaphoretic.  HENT:     Head: Normocephalic and atraumatic.     Right Ear: External ear normal. There is no impacted cerumen.     Left  Ear: External ear normal. Swelling and tenderness present.  No middle ear effusion. There is no impacted cerumen. No foreign body. Tympanic membrane is not injected or erythematous.     Ears:     Comments: Canal of the left ear is swollen and tender.    Mouth/Throat:     Mouth: Mucous membranes are moist.     Pharynx: Oropharynx is clear. No oropharyngeal exudate or posterior oropharyngeal erythema.  Eyes:     General: No scleral icterus.       Right eye: No discharge.        Left eye: No discharge.     Extraocular Movements: Extraocular movements intact.     Conjunctiva/sclera: Conjunctivae normal.     Pupils: Pupils are equal, round, and reactive to light.  Cardiovascular:     Rate and Rhythm: Normal rate and regular rhythm.  Pulmonary:     Effort: Pulmonary effort is normal. No respiratory distress.     Breath sounds: Normal breath sounds.  Abdominal:     Tenderness: There is no guarding.  Musculoskeletal:     Cervical back: No rigidity or tenderness.  Skin:    General: Skin is warm and dry.  Neurological:     Mental Status: She is alert and oriented to person, place, and time.  Psychiatric:  Mood and Affect: Mood normal.        Behavior: Behavior normal.      No results found for any visits on 04/28/22.    The 10-year ASCVD risk score (Arnett DK, et al., 2019) is: 1.5%    Assessment & Plan:   Problem List Items Addressed This Visit       Respiratory   Rhinitis     Nervous and Auditory   Acute swimmer's ear of left side - Primary   Relevant Medications   neomycin-polymyxin-hydrocortisone (CORTISPORIN) 3.5-10000-1 OTIC suspension     Other   Need for Tdap vaccination   Relevant Orders   Tdap vaccine greater than or equal to 7yo IM (Completed)    Return in about 1 week (around 05/05/2022), or if symptoms worsen or fail to improve.  4 drops of Cortisporin otic followed by a piece of cotton left in place for 15 minutes every 8 x7 days.  Return if not  improving within the week.  Continue follow-up with GYN follow-up here for health check fasting.  Advised that she should be able to lose some weight with the Topamax therapy.  Libby Maw, MD

## 2022-04-29 ENCOUNTER — Encounter (HOSPITAL_COMMUNITY): Payer: Self-pay

## 2022-04-29 ENCOUNTER — Telehealth: Payer: Self-pay | Admitting: Family Medicine

## 2022-04-29 ENCOUNTER — Emergency Department (HOSPITAL_COMMUNITY)
Admission: EM | Admit: 2022-04-29 | Discharge: 2022-04-29 | Disposition: A | Discharge status: Home / Self Care | Payer: 59 | Attending: Student | Admitting: Student

## 2022-04-29 ENCOUNTER — Other Ambulatory Visit: Payer: Self-pay

## 2022-04-29 DIAGNOSIS — H60509 Unspecified acute noninfective otitis externa, unspecified ear: Secondary | ICD-10-CM

## 2022-04-29 DIAGNOSIS — H60512 Acute actinic otitis externa, left ear: Secondary | ICD-10-CM | POA: Insufficient documentation

## 2022-04-29 DIAGNOSIS — H9202 Otalgia, left ear: Secondary | ICD-10-CM | POA: Diagnosis present

## 2022-04-29 MED ORDER — KETOROLAC TROMETHAMINE 15 MG/ML IJ SOLN
15.0000 mg | Freq: Once | INTRAMUSCULAR | Status: AC
  Administered 2022-04-29: 15 mg via INTRAMUSCULAR
  Filled 2022-04-29: qty 1

## 2022-04-29 MED ORDER — KETOROLAC TROMETHAMINE 15 MG/ML IJ SOLN
15.0000 mg | Freq: Once | INTRAMUSCULAR | Status: DC
Start: 2022-04-29 — End: 2022-04-29

## 2022-04-29 NOTE — ED Notes (Signed)
I provided reinforced discharge education based off of after visit summary/care provided. Pt acknowledged and understood my education. Pt had no further questions/concerns for provider/myself. After visit summary provided to pt. 

## 2022-04-29 NOTE — ED Triage Notes (Signed)
Pt reports recent left ear pain and was seen yesterday. Pt was given ear drops yesterday, but continues to have more pain.

## 2022-04-29 NOTE — ED Provider Notes (Signed)
[facility] DEPT Provider Note   CSN: [id number] Arrival date & time: 04/29/22  1820     History  Chief Complaint  Patient presents with   Ear Pain    Deasiah Besecker is a 41 y.o. female with medical history of depression.  The patient presents to the ED for evaluation of left-sided ear pain.  Patient reports that beginning 6 to 7 days ago she had developed left ear pain.  The patient was seen by her PCP yesterday and placed on Cortisporin drops.  Patient reports she has recently been swimming.  The patient states that she did not pick up this medication until this morning around 1130.  The patient states that she took it once, it did not relieve her pain, and she presented to the ED for evaluation.  The patient denies taking any over-the-counter medications for pain control at home such as ibuprofen or Tylenol.  The patient denies any drainage from the ear, fevers, nausea, vomiting.  HPI     Home Medications Prior to Admission medications   Medication Sig Start Date End Date Taking? Authorizing Provider  DULoxetine (CYMBALTA) 30 MG capsule Take 30 mg by mouth daily. 04/23/22   [provider]  neomycin-polymyxin-hydrocortisone (CORTISPORIN) 3.5-10000-1 OTIC suspension Place 4 drops into the left ear 4 (four) times daily for 7 days. 04/28/22 05/05/22  Libby Maw, MD  risperiDONE (RISPERDAL) 0.5 MG tablet Take 0.5 mg by mouth daily. 10/29/21   [provider]  topiramate (TOPAMAX) 50 MG tablet Take by mouth. 04/23/22   [provider]  venlafaxine XR (EFFEXOR-XR) 75 MG 24 hr capsule Take 75 mg by mouth daily. Patient not taking: Reported on 04/28/2022 01/23/21   [provider]      Allergies    Penicillins    Review of Systems   Review of Systems  Constitutional:  Negative for fever.  HENT:  Positive for ear pain.   Gastrointestinal:  Negative for nausea and vomiting.  All other systems reviewed and are  negative.   Physical Exam Updated Vital Signs BP 134/83 (BP Location: Left Arm)   Pulse 73   Temp 98.1 F (36.7 C) (Oral)   Resp 16   LMP 04/08/2022   SpO2 98%  Physical Exam Vitals and nursing note reviewed.  Constitutional:      General: She is not in acute distress.    Appearance: Normal appearance. She is not ill-appearing, toxic-appearing or diaphoretic.  HENT:     Head: Normocephalic and atraumatic.     Comments: External auditory canal left ear erythematous, swollen and tender.    Right Ear: Tympanic membrane normal.     Nose: Nose normal.     Mouth/Throat:     Mouth: Mucous membranes are moist.     Pharynx: Oropharynx is clear.  Eyes:     Extraocular Movements: Extraocular movements intact.     Pupils: Pupils are equal, round, and reactive to light.  Cardiovascular:     Rate and Rhythm: Normal rate and regular rhythm.  Pulmonary:     Effort: Pulmonary effort is normal.     Breath sounds: Normal breath sounds. No wheezing.  Abdominal:     General: Abdomen is flat. Bowel sounds are normal.     Tenderness: There is no abdominal tenderness.  Skin:    General: Skin is warm and dry.     Capillary Refill: Capillary refill takes less than 2 seconds.  Neurological:     Mental Status: She  is alert and oriented to person, place, and time.     ED Results / Procedures / Treatments   Labs (all labs ordered are listed, but only abnormal results are displayed) Labs Reviewed - No data to display  EKG None  Radiology No results found.  Procedures Procedures   Medications Ordered in ED Medications  ketorolac (TORADOL) 15 MG/ML injection 15 mg (15 mg Intramuscular Given 04/29/22 1925)    ED Course/ Medical Decision Making/ A&P                           Medical Decision Making Risk Prescription drug management.   41 year old female presents to the ED for evaluation of left ear pain.  Please see HPI for further details.  On examination the patient has a  swollen, erythematous and tender left external auditory canal.  The patient endorses recent swimming.  This is consistent with otitis externa.  The patient is already on antibiotic Cortisporin drops.  The patient has taken this medication once.  This does not indicate a treatment failure.  I advised the patient that in order for the medication to work as it is intended she will have to take it multiple times.  I advised the patient that in the meantime she can take ibuprofen or Tylenol for any pain.  Patient became upset with me and felt as if I was minimizing her problem.  I advised the patient that we would be happy to treat her pain with ibuprofen here but we would not be providing her any narcotic pain medication.  The patient was provided with a 15 mg IM Toradol shot.  The patient was discharged in stable condition.  Final Clinical Impression(s) / ED Diagnoses Final diagnoses:  Acute otitis externa, unspecified laterality, unspecified type    Rx / DC Orders ED Discharge Orders     None         Lawana Chambers 04/29/22 2041    Teressa Lower, MD 04/30/22 0104

## 2022-04-29 NOTE — Telephone Encounter (Signed)
Caller Name: Kourtnei Rauber Call back phone #: [phone]  Reason for Call: Pt was in yesterday (8/14) and received a shot, she says she is in a lot of pain. If you could please call her to go over recommended pain relievers. Has been taking Tylenol and not helping

## 2022-04-29 NOTE — Discharge Instructions (Signed)
Please return to the ED with any new symptom such as fevers measured with thermometer Please follow-up with your PCP for further management of your earache Please take antibiotics to full course completion Please continue taking ibuprofen at home for pain control Please read attached guide concerning her diagnosis, otitis externa

## 2022-04-30 NOTE — Telephone Encounter (Signed)
Spoke with patient who states that she ended up going to ED for pain today she's having some improvement.

## 2022-05-01 ENCOUNTER — Telehealth: Payer: Self-pay | Admitting: Family Medicine

## 2022-05-01 ENCOUNTER — Ambulatory Visit (INDEPENDENT_AMBULATORY_CARE_PROVIDER_SITE_OTHER): Payer: 59 | Admitting: Nurse Practitioner

## 2022-05-01 ENCOUNTER — Encounter: Payer: Self-pay | Admitting: Nurse Practitioner

## 2022-05-01 DIAGNOSIS — H60332 Swimmer's ear, left ear: Secondary | ICD-10-CM | POA: Diagnosis not present

## 2022-05-01 MED ORDER — NEOMYCIN-POLYMYXIN-HC 3.5-10000-1 OT SUSP: 4.0000 [drp] | Freq: Four times a day (QID) | OTIC | 0 refills | Status: AC

## 2022-05-01 NOTE — Telephone Encounter (Signed)
Patient has an appointment today

## 2022-05-01 NOTE — Telephone Encounter (Signed)
Caller Name: Sandra Sparks Call back phone #: [phone]  Reason for Call: Has an ear infection, believes that could be because of portable air conditioning unit. Went to hospital and was not treated so is scared to go back, doesn't want to make an appt right now. Asked for a call back.

## 2022-05-01 NOTE — Assessment & Plan Note (Signed)
She is having ongoing pain behind her left ear after being diagnosed with acute otitis media.  She does not have any pain with pinna movement.  Your canal still slightly red with mild swelling.  We will have her continue Cortisporin eardrops for another week, instructed her to take them 3-4 times a day instead of daily.  She can alternate Tylenol and ibuprofen to help with pain along with cool compresses.  Follow-up in 1 week if symptoms do not improve.

## 2022-05-01 NOTE — Progress Notes (Signed)
Acute Office Visit  Subjective:     Patient ID: Sandra Sparks, female    DOB: 1981/05/03, 41 y.o.   MRN: [id number]  Chief Complaint  Patient presents with   Ear Pain    Pt c/o ear pain in LT ear, fatigue, headache, dizziness, confusion, nasal congestion and disoriented x1 wk    HPI Patient is in today for ongoing left ear pain, nasal congestion, dizziness, and headache for 1 week. She saw her PCP on 04/28/22 and the ER on 04/29/22 who both diagnosed her acute otitis externa and to continue cortisporin ear drops. She does endorse swimming recently. She also endorses not having an appetite. She denies fevers and shortness of breath. She has been using the cortisporin ear drops daily. She describes the pain as a pressure and rates it an 8/10.   ROS See pertinent positives and negatives per HPI.     Objective:    BP 124/86   Pulse 91   Temp 97.8 F (36.6 C) (Temporal)   Wt 177 lb 12.8 oz (80.6 kg)   LMP 04/08/2022   SpO2 97%   BMI 29.59 kg/m    Physical Exam Vitals and nursing note reviewed.  Constitutional:      General: She is not in acute distress.    Appearance: Normal appearance.  HENT:     Head: Normocephalic.     Right Ear: Tympanic membrane, ear canal and external ear normal.     Left Ear: Tympanic membrane normal.     Ears:     Comments: Slight swelling and redness in her left ear canal Eyes:     Conjunctiva/sclera: Conjunctivae normal.  Cardiovascular:     Rate and Rhythm: Normal rate and regular rhythm.     Pulses: Normal pulses.     Heart sounds: Normal heart sounds.  Pulmonary:     Effort: Pulmonary effort is normal.     Breath sounds: Normal breath sounds.  Musculoskeletal:     Cervical back: Normal range of motion and neck supple. No tenderness.  Lymphadenopathy:     Cervical: No cervical adenopathy.  Skin:    General: Skin is warm.  Neurological:     General: No focal deficit present.     Mental Status: She is alert and oriented to person,  place, and time.  Psychiatric:        Mood and Affect: Mood normal.        Behavior: Behavior normal.        Thought Content: Thought content normal.        Judgment: Judgment normal.       Assessment & Plan:   Problem List Items Addressed This Visit       Nervous and Auditory   Acute swimmer's ear of left side    She is having ongoing pain behind her left ear after being diagnosed with acute otitis media.  She does not have any pain with pinna movement.  Your canal still slightly red with mild swelling.  We will have her continue Cortisporin eardrops for another week, instructed her to take them 3-4 times a day instead of daily.  She can alternate Tylenol and ibuprofen to help with pain along with cool compresses.  Follow-up in 1 week if symptoms do not improve.      Relevant Medications   neomycin-polymyxin-hydrocortisone (CORTISPORIN) 3.5-10000-1 OTIC suspension    Meds ordered this encounter  Medications   neomycin-polymyxin-hydrocortisone (CORTISPORIN) 3.5-10000-1 OTIC suspension    Sig: Place 4  drops into the left ear 4 (four) times daily for 7 days.    Dispense:  10 mL    Refill:  0    Return if symptoms worsen or fail to improve.  Charyl Dancer, NP

## 2022-05-01 NOTE — Patient Instructions (Signed)
It was great to see you!  Increase your cortisporin to 3-4 times a day for 7 more days.   You can alternate tylenol and ibuprofen every 3 hours. You can also try cool compresses on your ear.   Let's follow-up if your symptoms worsen or don't improve  Take care,  Vance Peper, NP

## 2023-07-27 ENCOUNTER — Emergency Department (HOSPITAL_COMMUNITY): Payer: 59

## 2023-07-27 ENCOUNTER — Encounter (HOSPITAL_COMMUNITY): Payer: Self-pay

## 2023-07-27 ENCOUNTER — Other Ambulatory Visit: Payer: Self-pay

## 2023-07-27 ENCOUNTER — Emergency Department (HOSPITAL_COMMUNITY)
Admission: EM | Admit: 2023-07-27 | Discharge: 2023-07-27 | Disposition: A | Discharge status: Home / Self Care | Payer: 59 | Attending: Emergency Medicine | Admitting: Emergency Medicine

## 2023-07-27 DIAGNOSIS — R079 Chest pain, unspecified: Secondary | ICD-10-CM | POA: Insufficient documentation

## 2023-07-27 DIAGNOSIS — Z1152 Encounter for screening for COVID-19: Secondary | ICD-10-CM | POA: Insufficient documentation

## 2023-07-27 DIAGNOSIS — B349 Viral infection, unspecified: Secondary | ICD-10-CM | POA: Insufficient documentation

## 2023-07-27 DIAGNOSIS — R0602 Shortness of breath: Secondary | ICD-10-CM | POA: Insufficient documentation

## 2023-07-27 DIAGNOSIS — R0981 Nasal congestion: Secondary | ICD-10-CM | POA: Diagnosis present

## 2023-07-27 LAB — BASIC METABOLIC PANEL
Anion gap: 7 (ref 5–15)
BUN: 8 mg/dL (ref 6–20)
CO2: 22 mmol/L (ref 22–32)
Calcium: 8.9 mg/dL (ref 8.9–10.3)
Chloride: 106 mmol/L (ref 98–111)
Creatinine, Ser: 1.03 mg/dL — ABNORMAL HIGH (ref 0.44–1.00)
GFR, Estimated: >60 mL/min (ref >60)
Glucose, Bld: 91 mg/dL (ref 70–99)
Potassium: 3.9 mmol/L (ref 3.5–5.1)
Sodium: 135 mmol/L (ref 135–145)

## 2023-07-27 LAB — HCG, SERUM, QUALITATIVE: Preg, Serum: NEGATIVE

## 2023-07-27 LAB — TROPONIN I (HIGH SENSITIVITY)
Troponin I (High Sensitivity): 3 ng/L (ref <18)
Troponin I (High Sensitivity): 2 ng/L (ref <18)

## 2023-07-27 LAB — CBC
HCT: 36.8 % (ref 36.0–46.0)
Hemoglobin: 11.4 g/dL — ABNORMAL LOW (ref 12.0–15.0)
MCH: 26 pg (ref 26.0–34.0)
MCHC: 31 g/dL (ref 30.0–36.0)
MCV: 84 fL (ref 80.0–100.0)
Platelets: 334 10*3/uL (ref 150–400)
RBC: 4.38 MIL/uL (ref 3.87–5.11)
RDW: 17.2 % — ABNORMAL HIGH (ref 11.5–15.5)
WBC: 5.6 10*3/uL (ref 4.0–10.5)
nRBC: 0 % (ref 0.0–0.2)

## 2023-07-27 LAB — SARS CORONAVIRUS 2 BY RT PCR: SARS Coronavirus 2 by RT PCR: NEGATIVE

## 2023-07-27 LAB — D-DIMER, QUANTITATIVE: D-Dimer, Quant: <0.27 ug{FEU}/mL (ref 0.00–0.50)

## 2023-07-27 MED ORDER — KETOROLAC TROMETHAMINE 15 MG/ML IJ SOLN
15.0000 mg | Freq: Once | INTRAMUSCULAR | Status: AC
Start: 2023-07-27 — End: 2023-07-27
  Administered 2023-07-27: 15 mg via INTRAVENOUS
  Filled 2023-07-27: qty 1

## 2023-07-27 MED ORDER — BENZONATATE 100 MG PO CAPS: 100.0000 mg | ORAL_CAPSULE | Freq: Three times a day (TID) | ORAL | 0 refills | Status: AC

## 2023-07-27 NOTE — ED Notes (Signed)
Pt verbalized understanding of discharge instructions. Pt refused discharge vital signes. Pt ambulated from ed with steady gait.

## 2023-07-27 NOTE — Discharge Instructions (Addendum)
Your blood work was reassuring, your heart enzymes were within normal limits, your chest x-ray is clear as well.  I believe that your pain is likely secondary to from your coughing, as I think that you have a viral illness.  Please take Mucinex, as well as lidocaine patches, for your cough, and chest pain.  Return to the ER if you feel like your symptoms are worsening.

## 2023-07-27 NOTE — ED Triage Notes (Signed)
Pt reports just getting back from a cruse to the [place]. Pt reports having chest pain starting a few weeks ago. Pt reports chest pain and shortness of breath worsening today.

## 2023-07-27 NOTE — ED Provider Notes (Signed)
[facility] EMERGENCY DEPARTMENT AT [facility] Provider Note   CSN: [id number] Arrival date & time: 07/27/23  1028     History  Chief Complaint  Patient presents with   Chest Pain   Shortness of Breath    Sandra Sparks is a 42 y.o. female, no pertinent past medical history, who presents to the ED secondary to right-sided chest pain that is been on for several weeks.  She states that it is pressurized, associated to the right side of the chest, and has shortness of breath associated with it.  Denies any nausea, vomiting, abdominal pain.  She states the pain does not radiate.  She did recently fly to [place], to take a cruise to the [place], she states he got worse today so she decided to come to the ER.  Is not any oral contraceptives, or hormonal products.  Denies any fevers, chills but does endorse some congestion.     Home Medications Prior to Admission medications   Medication Sig Start Date End Date Taking? Authorizing Provider  benzonatate (TESSALON) 100 MG capsule Take 1 capsule (100 mg total) by mouth every 8 (eight) hours. 07/27/23  Yes Sandra Dishman L, PA      Allergies    Penicillins    Review of Systems   Review of Systems  Respiratory:  Positive for shortness of breath.   Cardiovascular:  Positive for chest pain.    Physical Exam Updated Vital Signs BP 107/60   Pulse 63   Temp 98.3 F (36.8 C) (Oral)   Resp 17   Ht 5\' 5"  (1.651 m)   Wt 68.9 kg   LMP 07/13/2023   SpO2 100%   BMI 25.29 kg/m  Physical Exam Vitals and nursing note reviewed.  Constitutional:      General: She is not in acute distress.    Appearance: She is well-developed.  HENT:     Head: Normocephalic and atraumatic.  Eyes:     Conjunctiva/sclera: Conjunctivae normal.  Cardiovascular:     Rate and Rhythm: Normal rate and regular rhythm.     Heart sounds: No murmur heard. Pulmonary:     Effort: Pulmonary effort is normal. No respiratory distress.     Breath sounds:  Normal breath sounds.  Chest:     Chest wall: Tenderness present.     Comments: TTP of R chest wall, no wound/rash noted. Abdominal:     Palpations: Abdomen is soft.     Tenderness: There is no abdominal tenderness.  Musculoskeletal:        General: No swelling.     Cervical back: Neck supple.  Skin:    General: Skin is warm and dry.     Capillary Refill: Capillary refill takes less than 2 seconds.  Neurological:     Mental Status: She is alert.  Psychiatric:        Mood and Affect: Mood normal.     ED Results / Procedures / Treatments   Labs (all labs ordered are listed, but only abnormal results are displayed) Labs Reviewed  BASIC METABOLIC PANEL - Abnormal; Notable for the following components:      Result Value   Creatinine, Ser 1.03 (*)    All other components within normal limits  CBC - Abnormal; Notable for the following components:   Hemoglobin 11.4 (*)    RDW 17.2 (*)    All other components within normal limits  SARS CORONAVIRUS 2 BY RT PCR  HCG, SERUM, QUALITATIVE  D-DIMER, QUANTITATIVE  TROPONIN I (HIGH SENSITIVITY)  TROPONIN I (HIGH SENSITIVITY)    EKG None  Radiology DG Chest 2 View  Result Date: 07/27/2023 CLINICAL DATA:  Chest pain EXAM: CHEST - 2 VIEW COMPARISON:  11/24/2018 FINDINGS: The heart size and mediastinal contours are within normal limits. Both lungs are clear. The visualized skeletal structures are unremarkable except for slight curvature of the visualized spine. Trachea midline. IMPRESSION: No active cardiopulmonary disease. Electronically Signed   By: Judie Petit.  Shick M.D.   On: 07/27/2023 13:40    Procedures Procedures    Medications Ordered in ED Medications  ketorolac (TORADOL) 15 MG/ML injection 15 mg (15 mg Intravenous Given 07/27/23 1335)    ED Course/ Medical Decision Making/ A&P             HEART Score: 1                    Medical Decision Making Patient is a 42 year old female, here for chest pain, has been going on for  the last is several weeks, she states she flew, to [place] for a cruise, and has had chest pain since then.  We will obtain a D-dimer, given the recent flight, increased risk of PE, and as well as do a troponin.  She is overall well-appearing with clear lung sounds, but does have tenderness to palpation of the chest wall and suspicious for possible costochondritis given of possible upper respiratory infection as she appears to be congested, with a slight cough on exam.  Amount and/or Complexity of Data Reviewed Labs: ordered.    Details: Troponin x2 flat, d-dimer <0.27 Radiology: ordered.    Details: CXR clear ECG/medicine tests: ordered.    Details: NSR Discussion of management or test interpretation with external provider(s): Discussed with patient, lab is reassuring, troponins flat, d-dimer negative.  Heart score 1, patient feeling little better after Toradol.  I suspicious of costochondritis given the tenderness to palpation of the chest wall, negative troponins, negative D-dimer, thus making a PE unlikely.  We will send her home with some Tessalon Perles, and advised her to take lidocaine patches for the pain, on the chest wall.  She is afebrile, no ST changes, and EKG.  Discharged home with strict return precautions  Risk Prescription drug management.   Final Clinical Impression(s) / ED Diagnoses Final diagnoses:  Chest pain, unspecified type  Shortness of breath  Viral illness    Rx / DC Orders ED Discharge Orders          Ordered    benzonatate (TESSALON) 100 MG capsule  Every 8 hours        07/27/23 1433              Sandra Shafer L, PA 07/27/23 1443    Sandra Leiter, DO 07/28/23 [phone]

## 2023-07-28 ENCOUNTER — Telehealth: Payer: Self-pay

## 2023-07-28 NOTE — Transitions of Care (Post Inpatient/ED Visit) (Unsigned)
   07/28/2023  Name: Galylea Vega MRN: [id number] DOB: 1981/03/27  Today's TOC FU Call Status: Today's TOC FU Call Status:: Unsuccessful Call (1st Attempt) Unsuccessful Call (1st Attempt) Date: 07/28/23  Attempted to reach the patient regarding the most recent Inpatient/ED visit.  Follow Up Plan: Additional outreach attempts will be made to reach the patient to complete the Transitions of Care (Post Inpatient/ED visit) call.   Signature Arvil Persons, BSN, Charity fundraiser

## 2023-07-30 ENCOUNTER — Encounter: Payer: Self-pay | Admitting: Family Medicine

## 2023-07-30 ENCOUNTER — Telehealth: Payer: 59 | Admitting: Family Medicine

## 2023-07-30 VITALS — Wt 152.0 lb

## 2023-07-30 DIAGNOSIS — F1410 Cocaine abuse, uncomplicated: Secondary | ICD-10-CM | POA: Diagnosis not present

## 2023-07-30 NOTE — Progress Notes (Signed)
Established Patient Office Visit   Subjective:  Patient ID: Sandra Sparks, female    DOB: 29-Sep-1980  Age: 42 y.o. MRN: [id number]  Chief Complaint  Patient presents with   Drug Problem    Pt has had addiction to cocaine 10 years. Pt also uses marijuana and drink alcohol.   Chest pain are increases. Pt describes chest pain as pressure.   Pt has children ages, 40. 39. 74, 27.     Drug Problem Pertinent negatives include no loss of consciousness or weakness.   Encounter Diagnoses  Name Primary?   Cocaine abuse (HCC) Yes   Presents today to discuss her cocaine addiction and what to do about it going forward.  Feels as though she has been dependent/addicted over the last 10 to 15 years.  More recently she develops anxiety, irritability chest pain and shortness of breath when she tries to stop using.  Past medical history of bipolar disorder.  She is on medications for this issue but has not wanted to take them recently.  [facility]  She has discussed her problem with cocaine with her mental health provider.  She has 4 children ages 66 down to 26.  She is the sole provider of the 2 youngest children and does not feel as though she can go for inpatient treatment.  Recently returned back to [place] from a cruise she took with her children.   Review of Systems  Constitutional: Negative.   HENT: Negative.    Eyes:  Negative for blurred vision, discharge and redness.  Respiratory: Negative.    Cardiovascular: Negative.   Gastrointestinal:  Negative for abdominal pain.  Genitourinary: Negative.   Musculoskeletal: Negative.  Negative for myalgias.  Skin:  Negative for rash.  Neurological:  Negative for tingling, loss of consciousness and weakness.  Endo/Heme/Allergies:  Negative for polydipsia.     Current Outpatient Medications:    benzonatate (TESSALON) 100 MG capsule, Take 1 capsule (100 mg total) by mouth every 8 (eight) hours. (Patient not taking: Reported on  07/30/2023), Disp: 21 capsule, Rfl: 0   Objective:     Wt 152 lb (68.9 kg)   LMP 07/13/2023   BMI 25.29 kg/m    Physical Exam Constitutional:      General: She is not in acute distress.    Appearance: Normal appearance. She is not ill-appearing, toxic-appearing or diaphoretic.  HENT:     Head: Normocephalic and atraumatic.     Right Ear: External ear normal.     Left Ear: External ear normal.  Eyes:     General: No scleral icterus.       Right eye: No discharge.        Left eye: No discharge.     Extraocular Movements: Extraocular movements intact.     Conjunctiva/sclera: Conjunctivae normal.  Pulmonary:     Effort: Pulmonary effort is normal. No respiratory distress.  Skin:    General: Skin is warm and dry.  Neurological:     Mental Status: She is alert and oriented to person, place, and time.  Psychiatric:        Mood and Affect: Mood normal.        Behavior: Behavior normal.      No results found for any visits on 07/30/23.    The ASCVD Risk score (Arnett DK, et al., 2019) failed to calculate for the following reasons:   Cannot find a previous HDL lab   Cannot find a previous total cholesterol  lab    Assessment & Plan:   Cocaine abuse (HCC)    Return in about 8 weeks (around 09/24/2023).  Advised her to go to [facility] for evaluation for detox.  She could also consider [facility].  Also advised that it is critical for her to include her mental health providers at [facility].   Mliss Sax, MD  Virtual Visit via Video Note  I connected with Sandra Sparks on 07/30/23 at  3:40 PM EST by a video enabled telemedicine application and verified that I am speaking with the correct person using two identifiers.  Location: Patient: home with her children.  Provider: work   I discussed the limitations of evaluation and management by telemedicine and the availability of in person appointments. The patient expressed  understanding and agreed to proceed.  History of Present Illness:    Observations/Objective:   Assessment and Plan:   Follow Up Instructions:    I discussed the assessment and treatment plan with the patient. The patient was provided an opportunity to ask questions and all were answered. The patient agreed with the plan and demonstrated an understanding of the instructions.   The patient was advised to call back or seek an in-person evaluation if the symptoms worsen or if the condition fails to improve as anticipated.  I provided 25 minutes of non-face-to-face time during this encounter.   Mliss Sax, MD

## 2023-07-31 NOTE — Transitions of Care (Post Inpatient/ED Visit) (Unsigned)
   07/31/2023  Name: Sandra Sparks MRN: [id number] DOB: 1981/05/23  Today's TOC FU Call Status: Today's TOC FU Call Status:: Unsuccessful Call (2nd Attempt) Unsuccessful Call (1st Attempt) Date: 07/28/23 Unsuccessful Call (2nd Attempt) Date: 07/31/23  Attempted to reach the patient regarding the most recent Inpatient/ED visit.  Follow Up Plan: Additional outreach attempts will be made to reach the patient to complete the Transitions of Care (Post Inpatient/ED visit) call.   Signature Arvil Persons, BSN, Charity fundraiser

## 2023-08-03 NOTE — Transitions of Care (Post Inpatient/ED Visit) (Signed)
   08/03/2023  Name: Sandra Sparks MRN: [id number] DOB: August 09, 1981  Today's TOC FU Call Status: Today's TOC FU Call Status:: Unsuccessful Call (3rd Attempt) Unsuccessful Call (1st Attempt) Date: 07/28/23 Unsuccessful Call (2nd Attempt) Date: 07/31/23 Unsuccessful Call (3rd Attempt) Date: 08/03/23  Attempted to reach the patient regarding the most recent Inpatient/ED visit.  Follow Up Plan: No further outreach attempts will be made at this time. We have been unable to contact the patient.  Signature Arvil Persons, BSN, Charity fundraiser

## 2023-09-07 ENCOUNTER — Telehealth: Payer: Self-pay

## 2023-09-07 NOTE — Transitions of Care (Post Inpatient/ED Visit) (Unsigned)
   09/07/2023  Name: Sandra Sparks MRN: [id number] DOB: Nov 11, 1980  Today's TOC FU Call Status: Today's TOC FU Call Status:: Unsuccessful Call (1st Attempt) Unsuccessful Call (1st Attempt) Date: 09/07/23  Attempted to reach the patient regarding the most recent Inpatient/ED visit.  Follow Up Plan: Additional outreach attempts will be made to reach the patient to complete the Transitions of Care (Post Inpatient/ED visit) call.   Signature Arvil Persons, BSN, Charity fundraiser

## 2023-09-08 NOTE — Transitions of Care (Post Inpatient/ED Visit) (Unsigned)
   09/08/2023  Name: Sandra Sparks MRN: [id number] DOB: 05-20-1981  Today's TOC FU Call Status: Today's TOC FU Call Status:: Unsuccessful Call (1st Attempt) Unsuccessful Call (1st Attempt) Date: 09/07/23 Unsuccessful Call (2nd Attempt) Date: 09/08/23  Attempted to reach the patient regarding the most recent Inpatient/ED visit.  Follow Up Plan: Additional outreach attempts will be made to reach the patient to complete the Transitions of Care (Post Inpatient/ED visit) call.   Signature Arvil Persons, BSN, Charity fundraiser

## 2023-09-25 ENCOUNTER — Ambulatory Visit: Payer: 59 | Admitting: Nurse Practitioner

## 2023-09-28 ENCOUNTER — Ambulatory Visit: Payer: 59 | Admitting: Family Medicine

## 2023-09-29 ENCOUNTER — Ambulatory Visit: Payer: 59 | Admitting: Family Medicine

## 2023-10-20 ENCOUNTER — Ambulatory Visit: Payer: Self-pay | Admitting: Family Medicine

## 2023-10-20 NOTE — Telephone Encounter (Signed)
  Chief Complaint: Chest pain Symptoms: pain to left side of chest, SOB, anxiety Frequency: patient endorses chest pain has been going on since October Pertinent Negatives: Patient denies fever Disposition: [] ED /[x] Urgent Care (no appt availability in office) / [] Appointment(In office/virtual)/ []  [facility]/ [] Home Care/ [] Refused Recommended Disposition /[] [facility]/ []  Follow-up with PCP Additional Notes: patient endorses left sided chest pain since October 2024. Patient states she has a lot of anxiety, depression and stress. Patient states she has been having panic attacks. Patient endorses having been on anxiety and depression medication before but stopped. States she knows she needs to get back on those medications. Patient denies SI and HI. Per protocol, the recommendation is for Urgent Care to be evaluated. Patient verbalizes understanding of plan and all questions answered.     Copied from CRM [id number]. Topic: Clinical - Red Word Triage >> Oct 20, 2023  4:30 PM Thersia BROCKS wrote: [facility] that prompted transfer to Nurse Triage: Patient stated she has been having chest pains for a month now, having a lot of panic attacks , shortness of breath. Reason for Disposition  [1] Chest pain lasts > 5 minutes AND [2] occurred in past 3 days (72 hours) (Exception: Feels exactly the same as previously diagnosed heartburn and has accompanying sour taste in mouth.)  Answer Assessment - Initial Assessment Questions 1. LOCATION: Where does it hurt?       Left side of chest 2. RADIATION: Does the pain go anywhere else? (e.g., into neck, jaw, arms, back)     At times 3. ONSET: When did the chest pain begin? (Minutes, hours or days)      Chest pain started today this morning and has continued 4. PATTERN: Does the pain come and go, or has it been constant since it started?  Does it get worse with exertion?      constant 5. DURATION: How long does it last (e.g., seconds,  minutes, hours)     continued 6. SEVERITY: How bad is the pain?  (e.g., Scale 1-10; mild, moderate, or severe)    - MILD (1-3): doesn't interfere with normal activities     - MODERATE (4-7): interferes with normal activities or awakens from sleep    - SEVERE (8-10): excruciating pain, unable to do any normal activities       7 out of 10 7. CARDIAC RISK FACTORS: Do you have any history of heart problems or risk factors for heart disease? (e.g., angina, prior heart attack; diabetes, high blood pressure, high cholesterol, smoker, or strong family history of heart disease)     No 8. PULMONARY RISK FACTORS: Do you have any history of lung disease?  (e.g., blood clots in lung, asthma, emphysema, birth control pills)     No 9. CAUSE: What do you think is causing the chest pain?     Anxiety 10. OTHER SYMPTOMS: Do you have any other symptoms? (e.g., dizziness, nausea, vomiting, sweating, fever, difficulty breathing, cough)       Difficulty breathing but patient is speaking without any issue with RN  Protocols used: Chest Pain-A-AH

## 2023-10-21 NOTE — Telephone Encounter (Signed)
 I called and spoke with patient and advised her to go to urgent care or emergency room to be evaluated for chest pain. Patient declined and said that she is resting and does not have any pain now and has a follow up visit scheduled for tomorrow with Dr. Berneta. I told patient that Dr. Thedora also advised her to go and be seen  as well. Patient still declined and will follow up with Dr. Berneta tomorrow.

## 2023-10-21 NOTE — Telephone Encounter (Signed)
 Attempt to contact pt. LVM advising pt to seek care at the ED or UC for symptoms to avoid delaying care and to receive proper evaluation and treatment. Left the number to the office for her to return the call.

## 2023-10-22 ENCOUNTER — Ambulatory Visit: Payer: 59 | Admitting: Family Medicine

## 2023-10-22 ENCOUNTER — Ambulatory Visit: Payer: 59 | Admitting: Nurse Practitioner

## 2023-10-22 ENCOUNTER — Telehealth: Payer: Self-pay | Admitting: Family Medicine

## 2023-10-22 NOTE — Telephone Encounter (Signed)
 Pt was on dr berneta schedule for today at 1:20 and KO said anyone w/acute visits see if they would like to see lauren. I called the pt and schedule her with lauren not knowing the patient was scheduled for trouble breathing. Pt was here in office . We did not have any open slots in the morning so I asked the pt would she like her original appt with dr berneta at 1:20 asn the pt refused that and urgent care . I also check other [facility] office for morning slots but there was not any other office close

## 2023-10-22 NOTE — Telephone Encounter (Signed)
 TY for providing feedback. Pt should have been offered as a virtual visit.

## 2023-11-26 ENCOUNTER — Encounter: Payer: Self-pay | Admitting: Family Medicine

## 2023-11-26 ENCOUNTER — Ambulatory Visit (INDEPENDENT_AMBULATORY_CARE_PROVIDER_SITE_OTHER): Admitting: Family Medicine

## 2023-11-26 VITALS — BP 132/84 | HR 102 | Temp 97.8°F | Wt 146.4 lb

## 2023-11-26 DIAGNOSIS — H00014 Hordeolum externum left upper eyelid: Secondary | ICD-10-CM

## 2023-11-26 MED ORDER — ERYTHROMYCIN 5 MG/GM OP OINT: 1.0000 | TOPICAL_OINTMENT | Freq: Four times a day (QID) | OPHTHALMIC | 0 refills | Status: AC

## 2023-11-26 NOTE — Progress Notes (Signed)
 Assessment/Plan:   Hordeolum Chalazion in the left upper eyelid persisting for approximately two months, causing drainage and mild vision changes. She has been using warm compresses without significant improvement. No similar occurrences or significant pain, but a gritty sensation is present. Differential includes a persistent stye or chalazion, with the latter more likely given the chronicity and lack of resolution with conservative measures. - Prescribe erythromycin ophthalmic ointment to be applied four times daily for seven days. - Advise her to monitor for improvement and report back if no improvement is noted after one week. - Refer to ophthalmology for potential removal if no improvement with antibiotic treatment. - Send prescription to [facility].        Subjective:   Encounter date: 11/26/2023  Sandra Sparks is a 43 y.o. female who has Lipoma of right lower extremity; Acne vulgaris; Dysmenorrhea; Post traumatic stress disorder; Screen for STD (sexually transmitted disease); Dermoid cyst; Pelvic pain in female; Anemia; Rhinitis; Acute swimmer's ear of left side; Need for Tdap vaccination; and Cocaine abuse (HCC) on their problem list..   She  has a past medical history of Depression and Ovarian cyst..   She presents with chief complaint of Stye (On the left eye x 2 months) .   Discussed the use of AI scribe software for clinical note transcription with the patient, who gave verbal consent to proceed.  History of Present Illness   The patient presents with a stye in the left eye.  She has had a stye in her left eye for approximately two months, located on the left upper eyelid. The stye has not resolved during this time and is described as 'gritty or scratchy' with some drainage, but no significant pain.  She has been managing the stye with warm compresses but has not seen improvement. No history of previous styes.  She experiences some vision blurriness associated  with the stye.  No known allergies to antibiotics except for penicillin.       Review of Systems  Constitutional:  Negative for fever.  HENT:  Negative for congestion, ear discharge, sinus pain and sore throat.   Eyes:  Negative for blurred vision, double vision, photophobia, discharge and redness.    Past Surgical History:  Procedure Laterality Date   BACK SURGERY      Outpatient Medications Prior to Visit  Medication Sig Dispense Refill   benzonatate (TESSALON) 100 MG capsule Take 1 capsule (100 mg total) by mouth every 8 (eight) hours. (Patient not taking: Reported on 11/26/2023) 21 capsule 0   No facility-administered medications prior to visit.    Family History  Problem Relation Age of Onset   Diabetes Maternal Grandmother     Social History   Socioeconomic History   Marital status: Single    Spouse name: Not on file   Number of children: Not on file   Years of education: Not on file   Highest education level: Not on file  Occupational History   Not on file  Tobacco Use   Smoking status: Every Day    Current packs/day: 0.25    Types: Cigarettes   Smokeless tobacco: Never  Vaping Use   Vaping status: Never Used  Substance and Sexual Activity   Alcohol use: Yes    Comment: 3 x week   Drug use: Not Currently   Sexual activity: Not Currently    Birth control/protection: Surgical  Other Topics Concern   Not on file  Social History Narrative   Not on file  Social Drivers of Health   Financial Resource Strain: High Risk (10/22/2023)   Overall Financial Resource Strain (CARDIA)    Difficulty of Paying Living Expenses: Hard  Food Insecurity: Food Insecurity Present (10/22/2023)   Hunger Vital Sign    Worried About Running Out of Food in the Last Year: Often true    Ran Out of Food in the Last Year: Sometimes true  Transportation Needs: No Transportation Needs (10/22/2023)   PRAPARE - Administrator, Civil Service (Medical): No    Lack of  Transportation (Non-Medical): No  Physical Activity: Insufficiently Active (10/22/2023)   Exercise Vital Sign    Days of Exercise per Week: 2 days    Minutes of Exercise per Session: 30 min  Stress: Stress Concern Present (10/22/2023)   [employer] of Occupational Health - Occupational Stress Questionnaire    Feeling of Stress : Very much  Social Connections: Unknown (10/22/2023)   Social Connection and Isolation Panel [NHANES]    Frequency of Communication with Friends and Family: More than three times a week    Frequency of Social Gatherings with Friends and Family: Patient declined    Attends Religious Services: Patient declined    Database administrator or Organizations: No    Attends Engineer, structural: Not on file    Marital Status: Patient declined  Catering manager Violence: Not on file                                                                                                  Objective:  Physical Exam: BP 132/84   Pulse (!) 102   Temp 97.8 F (36.6 C) (Temporal)   Wt 146 lb 6.4 oz (66.4 kg)   LMP 11/12/2023   SpO2 97%   BMI 24.36 kg/m      Physical Exam Constitutional:      General: She is not in acute distress.    Appearance: Normal appearance. She is not ill-appearing or toxic-appearing.  HENT:     Head: Normocephalic and atraumatic.     Nose: Nose normal. No congestion.  Eyes:     General: Lids are everted, no foreign bodies appreciated. Vision grossly intact. Gaze aligned appropriately. No scleral icterus.       Left eye: Hordeolum present.    Extraocular Movements: Extraocular movements intact.     Pupils: Pupils are equal, round, and reactive to light.  Cardiovascular:     Rate and Rhythm: Normal rate and regular rhythm.     Pulses: Normal pulses.     Heart sounds: Normal heart sounds.  Pulmonary:     Effort: Pulmonary effort is normal. No respiratory distress.     Breath sounds: Normal breath sounds.  Abdominal:     General:  Abdomen is flat. Bowel sounds are normal.     Palpations: Abdomen is soft.  Musculoskeletal:        General: Normal range of motion.  Lymphadenopathy:     Cervical: No cervical adenopathy.  Skin:    General: Skin is warm and dry.     Findings: No rash.  Neurological:     General: No focal deficit present.     Mental Status: She is alert and oriented to person, place, and time. Mental status is at baseline.  Psychiatric:        Mood and Affect: Mood normal.        Behavior: Behavior normal.        Thought Content: Thought content normal.        Judgment: Judgment normal.     No results found.  No results found for this or any previous visit (from the past 2160 hours).      Garner Nash, MD, MS
